# Patient Record
Sex: Male | Born: 1995 | Race: White | Hispanic: No | Marital: Single | Smoking: Former smoker
Health system: Southern US, Community
[De-identification: ages and names within clinical notes are randomized; demographics above are authoritative.]

## PROBLEM LIST (undated history)

## (undated) DIAGNOSIS — J45909 Unspecified asthma, uncomplicated: Secondary | ICD-10-CM

---
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
## 2013-01-13 ENCOUNTER — Emergency Department (HOSPITAL_COMMUNITY)
Admission: EM | Admit: 2013-01-13 | Discharge: 2013-01-13 | Disposition: A | Discharge status: Home / Self Care | Payer: Self-pay | Attending: Emergency Medicine | Admitting: Emergency Medicine

## 2013-01-13 ENCOUNTER — Encounter (HOSPITAL_COMMUNITY): Payer: Self-pay

## 2013-01-13 DIAGNOSIS — Z79899 Other long term (current) drug therapy: Secondary | ICD-10-CM | POA: Insufficient documentation

## 2013-01-13 DIAGNOSIS — W540XXA Bitten by dog, initial encounter: Secondary | ICD-10-CM | POA: Insufficient documentation

## 2013-01-13 DIAGNOSIS — S41151A Open bite of right upper arm, initial encounter: Secondary | ICD-10-CM

## 2013-01-13 DIAGNOSIS — J45909 Unspecified asthma, uncomplicated: Secondary | ICD-10-CM | POA: Insufficient documentation

## 2013-01-13 DIAGNOSIS — Y929 Unspecified place or not applicable: Secondary | ICD-10-CM | POA: Insufficient documentation

## 2013-01-13 DIAGNOSIS — S41109A Unspecified open wound of unspecified upper arm, initial encounter: Secondary | ICD-10-CM | POA: Insufficient documentation

## 2013-01-13 DIAGNOSIS — Y9389 Activity, other specified: Secondary | ICD-10-CM | POA: Insufficient documentation

## 2013-01-13 MED ORDER — TETANUS-DIPHTH-ACELL PERTUSSIS 5-2.5-18.5 LF-MCG/0.5 IM SUSP
0.5000 mL | Freq: Once | INTRAMUSCULAR | Status: DC
  Filled 2013-01-13: qty 0.5

## 2013-01-13 MED ORDER — HYDROCODONE-ACETAMINOPHEN 5-325 MG PO TABS: 1.0000 | ORAL_TABLET | ORAL | Status: DC | PRN

## 2013-01-13 MED ORDER — AMOXICILLIN-POT CLAVULANATE 875-125 MG PO TABS
1.0000 | ORAL_TABLET | Freq: Once | ORAL | Status: AC
  Administered 2013-01-13: 1 via ORAL
  Filled 2013-01-13: qty 1

## 2013-01-13 MED ORDER — HYDROCODONE-ACETAMINOPHEN 5-325 MG PO TABS
1.0000 | ORAL_TABLET | Freq: Once | ORAL | Status: AC
  Administered 2013-01-13: 1 via ORAL
  Filled 2013-01-13: qty 1

## 2013-01-13 MED ORDER — AMOXICILLIN-POT CLAVULANATE 875-125 MG PO TABS: 1.0000 | ORAL_TABLET | Freq: Two times a day (BID) | ORAL | Status: DC

## 2013-01-13 NOTE — ED Provider Notes (Signed)
   History    CSN: [id number] Arrival date & time 01/13/13  4098  First MD Initiated Contact with Patient 01/13/13 1034     Chief Complaint  Patient presents with  . Animal Bite   (Consider location/radiation/quality/duration/timing/severity/associated sxs/prior Treatment) Patient is a 17 y.o. male presenting with animal bite. The history is provided by the patient. No language interpreter was used.  Animal Bite Contact animal:  Dog Associated symptoms: no fever and no numbness   Associated symptoms comment:  He was bitten by a dog known to him last night in the right axilla. He suffered a puncture wound and abrasions. He is here for evaluation of wound.   Past Medical History  Diagnosis Date  . Asthma    History reviewed. No pertinent past surgical history. No family history on file. History  Substance Use Topics  . Smoking status: Never Smoker   . Smokeless tobacco: Not on file  . Alcohol Use: No    Review of Systems  Constitutional: Negative for fever.  HENT: Negative for neck pain.   Gastrointestinal: Negative for nausea.  Musculoskeletal: Positive for myalgias.  Skin: Positive for wound.  Neurological: Negative for numbness.    Allergies  Review of patient's allergies indicates no known allergies.  Home Medications   Current Outpatient Rx  Name  Route  Sig  Dispense  Refill  . albuterol (PROAIR HFA) 108 (90 BASE) MCG/ACT inhaler   Inhalation   Inhale 2 puffs into the lungs every 6 (six) hours as needed for wheezing or shortness of breath.          BP 124/78  Pulse 65  Temp(Src) 98.7 F (37.1 C) (Oral)  Resp 18  Ht 5\' 9"  (1.753 m)  Wt 205 lb (92.987 kg)  BMI 30.26 kg/m2  SpO2 99% Physical Exam  Constitutional: He is oriented to person, place, and time. He appears well-developed and well-nourished.  Neck: Normal range of motion.  Pulmonary/Chest: Effort normal.  Musculoskeletal: Normal range of motion.  Neurological: He is alert and oriented to  person, place, and time.  Skin: Skin is warm and dry.  Abrasions and bruising right axilla with central large puncture wound. No bleeding, drainage, purulence. Tender without induration. Minimal swelling.  Psychiatric: He has a normal mood and affect.    ED Course  Procedures (including critical care time) Labs Reviewed - No data to display No results found. No diagnosis found. 1. Dog bite  MDM  Dog is know to patient and, per patient, Animal Control notified. He reports dog is up-to-date on vaccines.   Arnoldo Hooker, PA-C 01/13/13 1141

## 2013-01-13 NOTE — ED Notes (Signed)
Patient states he is up to date on the tetanus shot, refused tetanus

## 2013-01-13 NOTE — ED Notes (Signed)
Pt reports was bit under r arm by his girlfriend's dog.   Pt doesn't know if the dog's vaccinations are up to date or not.

## 2013-01-13 NOTE — ED Notes (Signed)
Reported dog bite to animal control.

## 2013-01-14 NOTE — ED Provider Notes (Signed)
Medical screening examination/treatment/procedure(s) were performed by non-physician practitioner and as supervising physician I was immediately available for consultation/collaboration.    Pearlene Teat J. Sylvain Hasten, MD 01/14/13 0816 

## 2013-08-14 ENCOUNTER — Emergency Department (HOSPITAL_COMMUNITY): Payer: Worker's Compensation

## 2013-08-14 ENCOUNTER — Emergency Department (HOSPITAL_COMMUNITY): Payer: Worker's Compensation | Admitting: Anesthesiology

## 2013-08-14 ENCOUNTER — Encounter (HOSPITAL_COMMUNITY)
Admission: EM | Disposition: A | Discharge status: Home / Self Care | Payer: Self-pay | Source: Home / Self Care | Attending: Emergency Medicine

## 2013-08-14 ENCOUNTER — Encounter (HOSPITAL_COMMUNITY): Payer: Worker's Compensation | Admitting: Anesthesiology

## 2013-08-14 ENCOUNTER — Encounter (HOSPITAL_COMMUNITY): Payer: Self-pay | Admitting: Emergency Medicine

## 2013-08-14 ENCOUNTER — Ambulatory Visit (HOSPITAL_COMMUNITY)
Admission: EM | Admit: 2013-08-14 | Discharge: 2013-08-14 | Disposition: A | Discharge status: Home / Self Care | Payer: Worker's Compensation | Attending: Emergency Medicine | Admitting: Emergency Medicine

## 2013-08-14 DIAGNOSIS — S66921A Laceration of unspecified muscle, fascia and tendon at wrist and hand level, right hand, initial encounter: Secondary | ICD-10-CM

## 2013-08-14 DIAGNOSIS — J45909 Unspecified asthma, uncomplicated: Secondary | ICD-10-CM | POA: Insufficient documentation

## 2013-08-14 DIAGNOSIS — IMO0002 Reserved for concepts with insufficient information to code with codable children: Secondary | ICD-10-CM | POA: Insufficient documentation

## 2013-08-14 DIAGNOSIS — S61411A Laceration without foreign body of right hand, initial encounter: Secondary | ICD-10-CM

## 2013-08-14 DIAGNOSIS — W3189XA Contact with other specified machinery, initial encounter: Secondary | ICD-10-CM | POA: Insufficient documentation

## 2013-08-14 DIAGNOSIS — Y9269 Other specified industrial and construction area as the place of occurrence of the external cause: Secondary | ICD-10-CM | POA: Insufficient documentation

## 2013-08-14 DIAGNOSIS — S61209A Unspecified open wound of unspecified finger without damage to nail, initial encounter: Secondary | ICD-10-CM | POA: Insufficient documentation

## 2013-08-14 DIAGNOSIS — S65509A Unspecified injury of blood vessel of unspecified finger, initial encounter: Secondary | ICD-10-CM | POA: Insufficient documentation

## 2013-08-14 DIAGNOSIS — Y990 Civilian activity done for income or pay: Secondary | ICD-10-CM | POA: Insufficient documentation

## 2013-08-14 DIAGNOSIS — S61219A Laceration without foreign body of unspecified finger without damage to nail, initial encounter: Secondary | ICD-10-CM

## 2013-08-14 HISTORY — PX: NERVE, TENDON AND ARTERY REPAIR: SHX5695

## 2013-08-14 SURGERY — NERVE, TENDON AND ARTERY REPAIR
Anesthesia: General | Site: Finger | Laterality: Right

## 2013-08-14 MED ORDER — ONDANSETRON HCL 4 MG/2ML IJ SOLN
INTRAMUSCULAR | Status: DC | PRN
  Administered 2013-08-14: 4 mg via INTRAVENOUS

## 2013-08-14 MED ORDER — OXYCODONE HCL 5 MG/5ML PO SOLN: 5.0000 mg | Freq: Once | ORAL | Status: DC | PRN

## 2013-08-14 MED ORDER — MIDAZOLAM HCL 2 MG/2ML IJ SOLN
INTRAMUSCULAR | Status: AC
  Filled 2013-08-14: qty 2

## 2013-08-14 MED ORDER — MORPHINE SULFATE 4 MG/ML IJ SOLN
4.0000 mg | Freq: Once | INTRAMUSCULAR | Status: AC
  Administered 2013-08-14: 4 mg via INTRAMUSCULAR
  Filled 2013-08-14: qty 1

## 2013-08-14 MED ORDER — SULFAMETHOXAZOLE-TRIMETHOPRIM 800-160 MG PO TABS: 1.0000 | ORAL_TABLET | Freq: Two times a day (BID) | ORAL | Status: DC

## 2013-08-14 MED ORDER — LIDOCAINE HCL (PF) 1 % IJ SOLN
INTRAMUSCULAR | Status: AC
  Filled 2013-08-14: qty 30

## 2013-08-14 MED ORDER — LIDOCAINE HCL (PF) 1 % IJ SOLN
INTRAMUSCULAR | Status: DC | PRN
  Administered 2013-08-14: 10 mL

## 2013-08-14 MED ORDER — OXYCODONE-ACETAMINOPHEN 5-325 MG PO TABS: ORAL_TABLET | ORAL | Status: DC

## 2013-08-14 MED ORDER — LIDOCAINE HCL (CARDIAC) 20 MG/ML IV SOLN
INTRAVENOUS | Status: DC | PRN
  Administered 2013-08-14: 100 mg via INTRAVENOUS

## 2013-08-14 MED ORDER — CEFAZOLIN SODIUM-DEXTROSE 2-3 GM-% IV SOLR
2.0000 g | INTRAVENOUS | Status: AC
  Administered 2013-08-14: 2 g via INTRAVENOUS

## 2013-08-14 MED ORDER — PROPOFOL 10 MG/ML IV BOLUS
INTRAVENOUS | Status: DC | PRN
  Administered 2013-08-14: 200 mg via INTRAVENOUS

## 2013-08-14 MED ORDER — BUPIVACAINE HCL (PF) 0.25 % IJ SOLN
INTRAMUSCULAR | Status: DC | PRN
  Administered 2013-08-14: 10 mL

## 2013-08-14 MED ORDER — OXYCODONE-ACETAMINOPHEN 5-325 MG PO TABS
1.0000 | ORAL_TABLET | Freq: Once | ORAL | Status: AC
  Administered 2013-08-14: 2 via ORAL

## 2013-08-14 MED ORDER — PROMETHAZINE HCL 25 MG/ML IJ SOLN: 6.2500 mg | INTRAMUSCULAR | Status: DC | PRN

## 2013-08-14 MED ORDER — LACTATED RINGERS IV SOLN
INTRAVENOUS | Status: DC | PRN
  Administered 2013-08-14 (×2): via INTRAVENOUS

## 2013-08-14 MED ORDER — TETANUS-DIPHTH-ACELL PERTUSSIS 5-2.5-18.5 LF-MCG/0.5 IM SUSP
0.5000 mL | Freq: Once | INTRAMUSCULAR | Status: AC
  Administered 2013-08-14: 0.5 mL via INTRAMUSCULAR
  Filled 2013-08-14: qty 0.5

## 2013-08-14 MED ORDER — FENTANYL CITRATE 0.05 MG/ML IJ SOLN
INTRAMUSCULAR | Status: DC | PRN
  Administered 2013-08-14 (×2): 50 ug via INTRAVENOUS
  Administered 2013-08-14: 100 ug via INTRAVENOUS
  Administered 2013-08-14: 50 ug via INTRAVENOUS

## 2013-08-14 MED ORDER — LIDOCAINE HCL (PF) 1 % IJ SOLN
INTRAVENOUS | Status: DC | PRN
  Administered 2013-08-14: 14:00:00

## 2013-08-14 MED ORDER — OXYCODONE HCL 5 MG PO TABS: 5.0000 mg | ORAL_TABLET | Freq: Once | ORAL | Status: DC | PRN

## 2013-08-14 MED ORDER — SODIUM CHLORIDE 0.9 % IR SOLN
Status: DC | PRN
  Administered 2013-08-14: 2000 mL

## 2013-08-14 MED ORDER — HEPARIN SODIUM (PORCINE) 1000 UNIT/ML IJ SOLN
INTRAMUSCULAR | Status: DC | PRN
  Administered 2013-08-14: 1000 [IU]

## 2013-08-14 MED ORDER — ONDANSETRON HCL 4 MG/2ML IJ SOLN
INTRAMUSCULAR | Status: AC
  Filled 2013-08-14: qty 2

## 2013-08-14 MED ORDER — BUPIVACAINE HCL (PF) 0.25 % IJ SOLN
INTRAMUSCULAR | Status: AC
  Filled 2013-08-14: qty 30

## 2013-08-14 MED ORDER — OXYCODONE-ACETAMINOPHEN 5-325 MG PO TABS
ORAL_TABLET | ORAL | Status: AC
  Filled 2013-08-14: qty 2

## 2013-08-14 MED ORDER — CEFAZOLIN SODIUM-DEXTROSE 2-3 GM-% IV SOLR
INTRAVENOUS | Status: AC
  Filled 2013-08-14: qty 50

## 2013-08-14 MED ORDER — HEPARIN SODIUM (PORCINE) 1000 UNIT/ML IJ SOLN
INTRAMUSCULAR | Status: AC
  Filled 2013-08-14: qty 1

## 2013-08-14 MED ORDER — HYDROMORPHONE HCL PF 1 MG/ML IJ SOLN: 0.2500 mg | INTRAMUSCULAR | Status: DC | PRN

## 2013-08-14 MED ORDER — CHLORHEXIDINE GLUCONATE 4 % EX LIQD: 60.0000 mL | Freq: Once | CUTANEOUS | Status: DC

## 2013-08-14 MED ORDER — MIDAZOLAM HCL 5 MG/5ML IJ SOLN
INTRAMUSCULAR | Status: DC | PRN
Start: 2013-08-14 — End: 2013-08-14
  Administered 2013-08-14: 2 mg via INTRAVENOUS

## 2013-08-14 SURGICAL SUPPLY — 57 items
BAG DECANTER FOR FLEXI CONT (MISCELLANEOUS) IMPLANT
BANDAGE ELASTIC 3 VELCRO ST LF (GAUZE/BANDAGES/DRESSINGS) ×3 IMPLANT
BANDAGE ELASTIC 4 VELCRO ST LF (GAUZE/BANDAGES/DRESSINGS) ×2 IMPLANT
BANDAGE GAUZE ELAST BULKY 4 IN (GAUZE/BANDAGES/DRESSINGS) ×2 IMPLANT
BLADE MINI RND TIP GREEN BEAV (BLADE) IMPLANT
BLADE SURG 15 STRL LF DISP TIS (BLADE) ×2 IMPLANT
BLADE SURG 15 STRL SS (BLADE) ×6
BNDG CMPR 9X4 STRL LF SNTH (GAUZE/BANDAGES/DRESSINGS) ×1
BNDG ESMARK 4X9 LF (GAUZE/BANDAGES/DRESSINGS) ×2 IMPLANT
CHLORAPREP W/TINT 26ML (MISCELLANEOUS) ×1 IMPLANT
CORDS BIPOLAR (ELECTRODE) ×3 IMPLANT
COVER MAYO STAND STRL (DRAPES) ×3 IMPLANT
COVER TABLE BACK 60X90 (DRAPES) ×1 IMPLANT
CUFF TOURNIQUET SINGLE 18IN (TOURNIQUET CUFF) ×2 IMPLANT
DECANTER SPIKE VIAL GLASS SM (MISCELLANEOUS) ×1 IMPLANT
DRAPE EXTREMITY T 121X128X90 (DRAPE) ×1 IMPLANT
DRAPE SURG 17X23 STRL (DRAPES) ×3 IMPLANT
GAUZE XEROFORM 1X8 LF (GAUZE/BANDAGES/DRESSINGS) ×3 IMPLANT
GLOVE BIO SURGEON STRL SZ7.5 (GLOVE) ×3 IMPLANT
GLOVE BIOGEL PI IND STRL 8 (GLOVE) ×1 IMPLANT
GLOVE BIOGEL PI INDICATOR 8 (GLOVE) ×2
GOWN BRE IMP PREV XXLGXLNG (GOWN DISPOSABLE) ×1 IMPLANT
GOWN PREVENTION PLUS XLARGE (GOWN DISPOSABLE) ×5 IMPLANT
KIT BASIN OR (CUSTOM PROCEDURE TRAY) ×3 IMPLANT
LOOP VESSEL MAXI BLUE (MISCELLANEOUS) IMPLANT
NDL HYPO 25X1 1.5 SAFETY (NEEDLE) IMPLANT
NDL SAFETY ECLIPSE 18X1.5 (NEEDLE) IMPLANT
NEEDLE HYPO 18GX1.5 SHARP (NEEDLE)
NEEDLE HYPO 25X1 1.5 SAFETY (NEEDLE) ×3 IMPLANT
NS IRRIG 1000ML POUR BTL (IV SOLUTION) ×1 IMPLANT
PADDING CAST ABS 3INX4YD NS (CAST SUPPLIES) ×2
PADDING CAST ABS 4INX4YD NS (CAST SUPPLIES) ×2
PADDING CAST ABS COTTON 3X4 (CAST SUPPLIES) IMPLANT
PADDING CAST ABS COTTON 4X4 ST (CAST SUPPLIES) ×1 IMPLANT
SPEAR EYE SURG WECK-CEL (MISCELLANEOUS) ×5 IMPLANT
SPLINT FIBERGLASS 3X12 (CAST SUPPLIES) ×2 IMPLANT
SPLINT PLASTER CAST XFAST 3X15 (CAST SUPPLIES) IMPLANT
SPLINT PLASTER XTRA FASTSET 3X (CAST SUPPLIES)
SPONGE GAUZE 4X4 12PLY (GAUZE/BANDAGES/DRESSINGS) ×3 IMPLANT
STOCKINETTE 4X48 STRL (DRAPES) ×1 IMPLANT
SUT ETHIBOND 3-0 V-5 (SUTURE) IMPLANT
SUT ETHILON 4 0 PS 2 18 (SUTURE) ×7 IMPLANT
SUT ETHILON 9 0 BV130 4 (SUTURE) ×4 IMPLANT
SUT FIBERWIRE 4-0 18 TAPR NDL (SUTURE) ×3
SUT MERSILENE 6 0 P 1 (SUTURE) IMPLANT
SUT NYLON 9 0 VRM6 (SUTURE) IMPLANT
SUT PROLENE 6 0 BV (SUTURE) ×2 IMPLANT
SUT PROLENE 6 0 P 1 18 (SUTURE) IMPLANT
SUT SILK 2 0 FS (SUTURE) ×4 IMPLANT
SUT SILK 4 0 PS 2 (SUTURE) IMPLANT
SUT VICRYL 4-0 PS2 18IN ABS (SUTURE) IMPLANT
SUTURE FIBERWR 4-0 18 TAPR NDL (SUTURE) IMPLANT
SYR 3ML LL SCALE MARK (SYRINGE) ×2 IMPLANT
SYR BULB 3OZ (MISCELLANEOUS) ×1 IMPLANT
SYR CONTROL 10ML LL (SYRINGE) ×2 IMPLANT
TOWEL OR 17X24 6PK STRL BLUE (TOWEL DISPOSABLE) ×6 IMPLANT
UNDERPAD 30X30 INCONTINENT (UNDERPADS AND DIAPERS) ×3 IMPLANT

## 2013-08-14 NOTE — Brief Op Note (Signed)
08/14/2013  4:39 PM  PATIENT:  Gayland Curryaniel C Farace  18 y.o. male  PRE-OPERATIVE DIAGNOSIS:  lacerations of right index and long fingers  POST-OPERATIVE DIAGNOSIS:  lacerations of right index and long fingers  PROCEDURE:  Procedure(s):  REPAIR OF INDEX FINGER DIGITAL NERVE AND REPAIR OF LONG FINGER DIGITAL NERVE AND ARTERY. REPAIR OF INDEX FLEXOR TENDON. (Right)  SURGEON:  Surgeon(s) and Role:    * Tami RibasKevin R Kryssa Risenhoover, MD - Primary  PHYSICIAN ASSISTANT:   ASSISTANTS: none   ANESTHESIA:   general  EBL:  Total I/O In: 1200 [I.V.:1200] Out: 5 [Blood:5]  BLOOD ADMINISTERED:none  DRAINS: none   LOCAL MEDICATIONS USED:  MARCAINE     SPECIMEN:  No Specimen  DISPOSITION OF SPECIMEN:  N/A  COUNTS:  YES  TOURNIQUET:   Total Tourniquet Time Documented: Upper Arm (Right) - 150 minutes Total: Upper Arm (Right) - 150 minutes   DICTATION: .Other Dictation: Dictation Number [id number]  PLAN OF CARE: Discharge to home after PACU  PATIENT DISPOSITION:  PACU - hemodynamically stable.

## 2013-08-14 NOTE — Op Note (Signed)
[id number] 

## 2013-08-14 NOTE — Preoperative (Signed)
Beta Blockers   Reason not to administer Beta Blockers:Not Applicable 

## 2013-08-14 NOTE — ED Provider Notes (Signed)
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 Injuring his middle and index fingers. No other injury. On exam patient has lacerations and middle finger, middle phalanx dorsal and volar aspects. And laceration and index finger at distal phalanx, volar aspect. He has full range of motion of fingers. However pain with flexion of the middle finger.  Doug SouSam Mishal Probert, MD 08/14/13 1655

## 2013-08-14 NOTE — Anesthesia Postprocedure Evaluation (Signed)
Anesthesia Post Note  Patient: [name]  Procedure(s) Performed: Procedure(s) (LRB):  REPAIR OF INDEX FINGER DIGITAL NERVE AND REPAIR OF LONG FINGER DIGITAL NERVE AND ARTERY. REPAIR OF INDEX FLEXOR TENDON. (Right)  Anesthesia type: general  Patient location: PACU  Post pain: Pain level controlled  Post assessment: Patient's Cardiovascular Status Stable  Last Vitals:  Filed Vitals:   08/14/13 1715  BP: 125/67  Pulse: 78  Temp:   Resp: 14    Post vital signs: Reviewed and stable  Level of consciousness: sedated  Complications: No apparent anesthesia complications

## 2013-08-14 NOTE — Anesthesia Preprocedure Evaluation (Addendum)
Anesthesia Evaluation  Patient identified by MRN, date of birth, ID band Patient awake    Reviewed: Allergy & Precautions, H&P , NPO status , Patient's Chart, lab work & pertinent test results  Airway Mallampati: III TM Distance: >3 FB Neck ROM: Full    Dental  (+) Teeth Intact and Dental Advisory Given   Pulmonary asthma ,    Pulmonary exam normal       Cardiovascular negative cardio ROS      Neuro/Psych negative neurological ROS  negative psych ROS   GI/Hepatic negative GI ROS, Neg liver ROS,   Endo/Other  negative endocrine ROS  Renal/GU negative Renal ROS     Musculoskeletal   Abdominal Normal abdominal exam  (+)   Peds  Hematology   Anesthesia Other Findings   Reproductive/Obstetrics negative OB ROS                         Anesthesia Physical Anesthesia Plan  ASA: II and emergent  Anesthesia Plan: General LMA   Post-op Pain Management:    Induction:   Airway Management Planned:   Additional Equipment:   Intra-op Plan:   Post-operative Plan:   Informed Consent: I have reviewed the patients History and Physical, chart, labs and discussed the procedure including the risks, benefits and alternatives for the proposed anesthesia with the patient or authorized representative who has indicated his/her understanding and acceptance.   Dental Advisory Given  Plan Discussed with: Anesthesiologist, CRNA and Surgeon  Anesthesia Plan Comments:        Anesthesia Quick Evaluation

## 2013-08-14 NOTE — Anesthesia Procedure Notes (Signed)
Procedure Name: LMA Insertion Date/Time: 08/14/2013 1:46 PM Performed by: Alanda AmassFRIEDMAN, Reshunda Strider A Pre-anesthesia Checklist: Patient identified, Timeout performed, Emergency Drugs available, Suction available and Patient being monitored Patient Re-evaluated:Patient Re-evaluated prior to inductionOxygen Delivery Method: Circle system utilized Preoxygenation: Pre-oxygenation with 100% oxygen Intubation Type: IV induction LMA: LMA inserted LMA Size: 5.0 Number of attempts: 1 Placement Confirmation: positive ETCO2 and breath sounds checked- equal and bilateral Tube secured with: Tape Dental Injury: Teeth and Oropharynx as per pre-operative assessment

## 2013-08-14 NOTE — ED Provider Notes (Signed)
Medical screening examination/treatment/procedure(s) were conducted as a shared visit with non-physician practitioner(s) and myself.  I personally evaluated the patient during the encounter.     Doug SouSam Jeilyn Reznik, MD 08/14/13 1655

## 2013-08-14 NOTE — Discharge Instructions (Signed)

## 2013-08-14 NOTE — Transfer of Care (Signed)
Immediate Anesthesia Transfer of Care Note  Patient: [name]  Procedure(s) Performed: Procedure(s):  REPAIR OF INDEX FINGER DIGITAL NERVE AND REPAIR OF LONG FINGER DIGITAL NERVE AND ARTERY. REPAIR OF INDEX FLEXOR TENDON. (Right)  Patient Location: PACU  Anesthesia Type:General  Level of Consciousness: awake  Airway & Oxygen Therapy: Patient Spontanous Breathing  Post-op Assessment: Report given to PACU RN and Post -op Vital signs reviewed and stable  Post vital signs: Reviewed and stable  Complications: No apparent anesthesia complications

## 2013-08-14 NOTE — H&P (Signed)
  [name] is an 18 y.o. male.   Chief Complaint: right index and long finger lacerations HPI: 18 yo rhd male states he lacerated right index and long fingers at work today when a valve closed on them.  [facility]  NPO over six hours.  Reports no previous injury to right hand and no other injury at this time.  Past Medical History  Diagnosis Date  . Asthma     History reviewed. No pertinent past surgical history.  History reviewed. No pertinent family history. Social History:  reports that he has never smoked. He does not have any smokeless tobacco history on file. He reports that he does not drink alcohol or use illicit drugs.  Allergies: No Known Allergies  Medications Prior to Admission  Medication Sig Dispense Refill  . albuterol (PROAIR HFA) 108 (90 BASE) MCG/ACT inhaler Inhale 2 puffs into the lungs every 6 (six) hours as needed for wheezing or shortness of breath.        No results found for this or any previous visit (from the past 48 hour(s)).  Dg Hand Complete Right  08/14/2013   CLINICAL DATA:  Deep lacerations to the right index and middle finger.  EXAM: RIGHT HAND - COMPLETE 3+ VIEW  COMPARISON:  Wrist 03/03/2012  FINDINGS: Negative for a fracture or dislocation. No evidence for a radiopaque foreign body. Alignment of the right hand is within normal limits.  IMPRESSION: No acute bone abnormality to the right hand.   Electronically Signed   By: Richarda OverlieAdam  Henn M.D.   On: 08/14/2013 11:00     A comprehensive review of systems was negative.  Blood pressure 114/63, pulse 66, temperature 97.8 F (36.6 C), temperature source Oral, resp. rate 15, height 5\' 9"  (1.753 m), weight 210 lb (95.255 kg), SpO2 96.00%.  General appearance: alert, cooperative and appears stated age Head: Normocephalic, without obvious abnormality, atraumatic Neck: supple, symmetrical, trachea midline Resp: clear to auscultation bilaterally Cardio: regular rate and rhythm GI: non  tender Extremities: intact sensation and capillary refill all digits except right index and long with decreased sensation (greater in long than index).  brisk capillary refill all digits.  +epl/fpl/io.  weak dip flexion right long and index.  laceration volar right index finger at distal phalanx.  laceration right long finger volarly just proximal to dip joint. Pulses: 2+ and symmetric Skin: Skin color, texture, turgor normal. No rashes or lesions Neurologic: Grossly normal Incision/Wound: As above  Assessment/Plan Right index and long finger lacerations with possible tendon/artery/nerve laceration.  Recommend OR for exploration and repair as necessary.  Risks, benefits, and alternatives of surgery were discussed and the patient agrees with the plan of care.   [name] 08/14/2013, 1:14 PM

## 2013-08-14 NOTE — Progress Notes (Signed)
Orthopedic Tech Progress Note Patient Details:  Bradley CurryDaniel C [name] Aug 15, 1995 [id number]  Ortho Devices Type of Ortho Device: Arm sling Ortho Device/Splint Interventions: Application   Cammer, Mickie BailJennifer Carol 08/14/2013, 5:15 PM

## 2013-08-14 NOTE — ED Notes (Signed)
All patient belongings given to parents, mother and father Waynard EdwardsMelanie Funder[place] and [place].

## 2013-08-14 NOTE — ED Provider Notes (Signed)
CSN: [id number]     Arrival date & time 08/14/13  0945 History   First MD Initiated Contact with Patient 08/14/13 [phone]     Chief Complaint  Patient presents with  . Hand Injury   (Consider location/radiation/quality/duration/timing/severity/associated sxs/prior Treatment) Patient is a 18 y.o. male presenting with hand injury. The history is provided by the patient and medical records.  Hand Injury  This is a 18 year old male with past medical history significant for asthma, presenting to the ED for right hand injury. Patient states he was at work when his hand was caught in a machine at [employer]enses [employer].  Patient states his hand was somewhat crushed between the parts of the machine and sustained laceration to his right index and middle fingers. Patient was wearing 2 sets of gloves at the time and denied any chemical exposure to hand.  Patient is right-hand dominant. Last PO intake was 0600.  Date of last tetanus unknown.  Past Medical History  Diagnosis Date  . Asthma    History reviewed. No pertinent past surgical history. History reviewed. No pertinent family history. History  Substance Use Topics  . Smoking status: Never Smoker   . Smokeless tobacco: Not on file  . Alcohol Use: No    Review of Systems  Skin: Positive for wound.  All other systems reviewed and are negative.    Allergies  Review of patient's allergies indicates no known allergies.  Home Medications   Current Outpatient Rx  Name  Route  Sig  Dispense  Refill  . albuterol (PROAIR HFA) 108 (90 BASE) MCG/ACT inhaler   Inhalation   Inhale 2 puffs into the lungs every 6 (six) hours as needed for wheezing or shortness of breath.         Marland Kitchen amoxicillin-clavulanate (AUGMENTIN) 875-125 MG per tablet   Oral   Take 1 tablet by mouth every 12 (twelve) hours.   14 tablet   0   . HYDROcodone-acetaminophen (NORCO/VICODIN) 5-325 MG per tablet   Oral   Take 1 tablet by mouth every 4 (four) hours as needed for  pain.   12 tablet   0    BP 137/81  Pulse 69  Temp(Src) 97.8 F (36.6 C) (Oral)  Resp 14  Ht 5\' 9"  (1.753 m)  Wt 210 lb (95.255 kg)  BMI 31.00 kg/m2  SpO2 100%  Physical Exam  Nursing note and vitals reviewed. Constitutional: He is oriented to person, place, and time. He appears well-developed and well-nourished. No distress.  HENT:  Head: Normocephalic and atraumatic.  Mouth/Throat: Oropharynx is clear and moist.  Eyes: Conjunctivae and EOM are normal. Pupils are equal, round, and reactive to light.  Neck: Normal range of motion.  Cardiovascular: Normal rate, regular rhythm and normal heart sounds.   Pulmonary/Chest: Effort normal and breath sounds normal. No respiratory distress. He has no wheezes.  Musculoskeletal: Normal range of motion.       Hands: Lacerations to right volar index and middle fingers Index finger laceration of distal phalanx Middle finger laceration of middle phalanx Full ROM of PIP joints, limited flexon of DIP joints of involved fingers; some active bleeding; strong radial pulse and cap refill; decreased sensation at distal tips of involved fingers  Neurological: He is alert and oriented to person, place, and time.  Skin: Skin is warm and dry. He is not diaphoretic.  Psychiatric: He has a normal mood and affect.       ED Course  Procedures (including critical care time) Labs Review  Labs Reviewed - No data to display Imaging Review Dg Hand Complete Right  08/14/2013   CLINICAL DATA:  Deep lacerations to the right index and middle finger.  EXAM: RIGHT HAND - COMPLETE 3+ VIEW  COMPARISON:  Wrist 03/03/2012  FINDINGS: Negative for a fracture or dislocation. No evidence for a radiopaque foreign body. Alignment of the right hand is within normal limits.  IMPRESSION: No acute bone abnormality to the right hand.   Electronically Signed   By: Richarda OverlieAdam  Henn M.D.   On: 08/14/2013 11:00    EKG Interpretation   None       MDM   1. Laceration of right  hand with tendon involvement including fingers     X-ray negative for acute fracture. Tetanus updated.  Pt continues to have some difficulty flexing the right middle finger at DIP joint and decreased sensation from baseline-- questionable partial flexor tendon injury. Consulted hand surgery, Dr. Merlyn LotKuzma, will evaluate pt in the ED and determine plan of care.  Pt will continue to be kept NPO.   Dr. Merlyn LotKuzma has evaluated pt in the ED and elected to take pt to OR for wound exploration and repair.  Pt remains stable, NAD.  Garlon HatchetLisa M Sanders, PA-C 08/14/13 1316

## 2013-08-14 NOTE — ED Notes (Addendum)
Patient presents to ED via EMS. Patient was at work when his index and middle fingers on right hand were caught in a machine. EMS placed dressing with bleeding controled.

## 2013-08-15 NOTE — Op Note (Signed)
NAMLucas [name]:  [name], [name]           ACCOUNT NO.:  [id number]  MEDICAL RECORD NO.:  [id number]  LOCATION:  [facility]                         FACILITY:  [facility]  PHYSICIAN:  Betha LoaKevin Hollie Wojahn, MD        DATE OF BIRTH:  12/12/1995  DATE OF PROCEDURE:  08/14/2013 DATE OF DISCHARGE:  08/14/2013                              OPERATIVE REPORT   PREOPERATIVE DIAGNOSES:  Right index and long finger lacerations with tendon, artery, nerve injury.  POSTOPERATIVE DIAGNOSES:  Right index finger FDP laceration and radial digital nerve branch laceration and right long finger ulnar digital nerve and artery laceration.  PROCEDURE:   1. Right index finger repair of FDP laceration 2. Repair of index radial digital nerve branch under microscope  3. Repair of right long finger ulnar digital nerve under microscope 4. Repair of right long finger ulnar digital artery under microscope  SURGEON:  Betha LoaKevin Iylah Dworkin, MD  ASSISTANT:  None.  ANESTHESIA:  General.  IV FLUIDS:  Per Anesthesia flow sheet.  ESTIMATED BLOOD LOSS:  Minimal.  COMPLICATIONS:  None.  SPECIMENS:  None.  TOURNIQUET TIME:  150 minutes.  DISPOSITION:  Stable to PACU.  INDICATIONS:  Mr. [name] is a 18 year old right-hand dominant male who states at work this morning, he lacerated his right index and long finger on a valve body.  [facility], I was consulted for management of the injury. On evaluation, he had decreased sensation in the tips of the index and long finger and pain with flexion of the DIP joints.  I recommended going to the operating room for exploration of the wounds with possible tendon, artery, and nerve repair as necessary.  Risks, benefits, and alternatives of surgery were discussed including risk of blood loss, infection, damage to nerves, vessels, tendons, ligaments, bone; failure of surgery; need for additional surgery, complications with wound healing, continued  pain, and stiffness.  He voiced understanding these risks and elected to proceed.  OPERATIVE COURSE:  After being identified preoperatively by myself, the patient and I agreed upon procedure and site of procedure.  Surgical site was marked.  The risks, benefits, and alternatives of surgery were reviewed and wished to proceed.  Surgical consent had been signed.  He was given IV Ancef as preoperative antibiotic prophylaxis.  He was transferred to the operating room and placed on the operating room table in supine position with the right upper extremity on arm board.  General anesthesia was induced by the anesthesiologist.  The right upper extremity was prepped and draped in normal sterile orthopedic fashion. A surgical pause was performed between surgeon, anesthesia, operating staff, and all were in agreement as to the patient, procedure, and site of procedure.  Tourniquet at the proximal aspect of the extremity was inflated to 250 mmHg after exsanguination of the limb with an Esmarch bandage.  The wounds were extended both proximally and distally in a Brunner fashion.  In the long finger, the flexor tendon was identified and was intact.  The sheath was not violated.  The ulnar digital nerve and artery were identified and had been completely lacerated.  The radial digital nerve and artery were identified and were  intact.  In the index finger, there was laceration of the FDP insertion distally.  The proximal aspect of the insertion was intact.  The radial digital nerve and artery were identified, and there was a laceration to a branch of the radial digital nerve after the trifurcation.  The ulnar digital nerve and artery were identified and were intact.  The wounds were copiously irrigated with 2000 mL of sterile saline by cysto tubing.  The laceration to the index finger, FDP tendon was repaired with 4-0 FiberWire suture in a figure-of-eight fashion.  The microscope was brought in.  The  index finger was addressed first.  The branch of the radial digital nerve was repaired with 9-0 nylon suture in an interrupted circumferential fashion.  The radial digital artery was identified and was intact.  The ulnar digital nerve and artery were visualized under the microscope and were intact including the trifurcation of the nerve.  Attention was turned to the long finger. The radial digital nerve and artery were identified and were intact. The ulnar digital nerve and artery were 100% lacerated.  The artery was cleared of its adventitia and the lumen cleared of clot.  It was rinsed with Dora Sims solution.  The 9-0 nylon suture was used in an interrupted circumferential fashion to repair the artery.  Good apposition was obtained.  The proximal stump had to be shortened because of a split in the artery.  There was no tension.  The ulnar digital nerve was repaired in an interrupted circumferential fashion as well. This provided good apposition of the nerve ends.  The wounds were again copiously irrigated with sterile saline.  They were closed with 4-0 nylon suture in a horizontal mattress and interrupted fashion.  Digital block was performed to the index and long finger with 10 mL of 0.25% plain Marcaine to aid in postoperative analgesia.  The wounds were then dressed with sterile Xeroform, 4x4s, and wrapped with a Kerlix bandage. A dorsal blocking splint was placed with the wrist flexed approximately 40 degrees and the MPs flexed and IPs slightly flexed, this took tension off the ulnar digital nerve repair of the long finger.  This was wrapped with Kerlix and Ace bandage.  Tourniquet had been deflated at 150 minutes.  The fingertips were all pink with brisk capillary refill after deflation of tourniquet.  Operative drapes were broken down.  The patient was awoken from anesthesia safely.  He was transferred back to stretcher and taken to PACU in stable condition.  I will see him back  in the office in 1 week for postoperative followup.  I will give him Percocet 5/325, 1-2 p.o. q.6h. p.r.n. pain, dispensed #40 and Bactrim DS 1 p.o. b.i.d. x7 days.     Betha Loa, MD     KK/MEDQ  D:  08/14/2013  T:  08/15/2013  Job:  [id number]

## 2013-08-16 ENCOUNTER — Encounter (HOSPITAL_COMMUNITY): Payer: Self-pay | Admitting: Orthopedic Surgery

## 2013-09-07 ENCOUNTER — Encounter (HOSPITAL_COMMUNITY): Payer: Self-pay | Admitting: Emergency Medicine

## 2013-09-07 ENCOUNTER — Emergency Department (HOSPITAL_COMMUNITY): Payer: BC Managed Care – PPO

## 2013-09-07 ENCOUNTER — Emergency Department (HOSPITAL_COMMUNITY)
Admission: EM | Admit: 2013-09-07 | Discharge: 2013-09-07 | Discharge status: Against Medical Advice | Payer: BC Managed Care – PPO | Attending: Emergency Medicine | Admitting: Emergency Medicine

## 2013-09-07 DIAGNOSIS — S6990XA Unspecified injury of unspecified wrist, hand and finger(s), initial encounter: Secondary | ICD-10-CM | POA: Insufficient documentation

## 2013-09-07 DIAGNOSIS — Z792 Long term (current) use of antibiotics: Secondary | ICD-10-CM | POA: Insufficient documentation

## 2013-09-07 DIAGNOSIS — Y9389 Activity, other specified: Secondary | ICD-10-CM | POA: Insufficient documentation

## 2013-09-07 DIAGNOSIS — S60219A Contusion of unspecified wrist, initial encounter: Secondary | ICD-10-CM | POA: Insufficient documentation

## 2013-09-07 DIAGNOSIS — J45909 Unspecified asthma, uncomplicated: Secondary | ICD-10-CM | POA: Insufficient documentation

## 2013-09-07 DIAGNOSIS — S60212A Contusion of left wrist, initial encounter: Secondary | ICD-10-CM

## 2013-09-07 DIAGNOSIS — Z79899 Other long term (current) drug therapy: Secondary | ICD-10-CM | POA: Insufficient documentation

## 2013-09-07 DIAGNOSIS — Y92410 Unspecified street and highway as the place of occurrence of the external cause: Secondary | ICD-10-CM | POA: Insufficient documentation

## 2013-09-07 MED ORDER — ACETAMINOPHEN 500 MG PO TABS: 1000.0000 mg | ORAL_TABLET | Freq: Once | ORAL | Status: DC
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
## 2013-09-07 NOTE — ED Notes (Signed)
PT reports he is now experiencing sharp chest pains that come and go and is SOB.

## 2013-09-07 NOTE — ED Notes (Signed)
Pt refuses to stay for any further treatment, risks discussed with pt by the MD. Pt states he understands the risks and that he cannot stay any longer.

## 2013-09-07 NOTE — ED Notes (Signed)
MD at bedside. 
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
## 2013-09-07 NOTE — ED Notes (Signed)
ED EKG not performed as pt refused to allow us to carry out the order. Tylenol not adm as the pt refused the medication/any further treatment.

## 2014-01-18 ENCOUNTER — Institutional Professional Consult (permissible substitution): Payer: Self-pay | Admitting: Internal Medicine

## 2014-01-26 ENCOUNTER — Encounter: Payer: Self-pay | Admitting: *Deleted

## 2014-01-26 ENCOUNTER — Ambulatory Visit (INDEPENDENT_AMBULATORY_CARE_PROVIDER_SITE_OTHER): Payer: BC Managed Care – PPO | Admitting: Internal Medicine

## 2014-01-26 ENCOUNTER — Encounter: Payer: Self-pay | Admitting: Internal Medicine

## 2014-01-26 VITALS — BP 128/76 | HR 65 | Temp 97.9°F | Ht 69.0 in | Wt 211.2 lb

## 2014-01-26 DIAGNOSIS — J45909 Unspecified asthma, uncomplicated: Secondary | ICD-10-CM | POA: Insufficient documentation

## 2014-01-26 MED ORDER — ALBUTEROL SULFATE HFA 108 (90 BASE) MCG/ACT IN AERS: 2.0000 | INHALATION_SPRAY | Freq: Four times a day (QID) | RESPIRATORY_TRACT | Status: DC | PRN

## 2014-01-26 NOTE — Assessment & Plan Note (Signed)
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

## 2014-01-26 NOTE — Progress Notes (Signed)
   Subjective:    Patient ID: Bradley CurryDaniel C Radick, male    DOB: [birth year] MRN: [id number]  HPI  18 yowm asthma dx age 89 no symptoms or need for rescue  inhaler this year but needs clearance for [employer].  Has had ? eia in past. With viral uri's has needed saba in past but not in last year. Has not seen asthma specialist/ allergist  Has been running in heat ok x 30 min doing ok s rescue all conditions  No obvious day to day or daytime variabilty or assoc chronic cough or cp or chest tightness, subjective wheeze overt sinus or hb symptoms. No unusual exp hx or h/o childhood pna/ asthma or knowledge of premature birth.  Sleeping ok without nocturnal  or early am exacerbation  of respiratory  c/o's or need for noct saba. Also denies any obvious fluctuation of symptoms with weather or environmental changes or other aggravating or alleviating factors except as outlined above   Current Medications, Allergies, Complete Past Medical History, Past Surgical History, Family History, and Social History were reviewed in [facility] electronic medical record.  ROS  The following are not active complaints unless bolded sore throat, dysphagia, dental problems, itching, sneezing,  nasal congestion or excess/ purulent secretions, ear ache,   fever, chills, sweats, unintended wt loss, pleuritic or exertional cp, hemoptysis,  orthopnea pnd or leg swelling, presyncope, palpitations, heartburn, abdominal pain, anorexia, nausea, vomiting, diarrhea  or change in bowel or urinary habits, change in stools or urine, dysuria,hematuria,  rash, arthralgias, visual complaints, headache, numbness weakness or ataxia or problems with walking or coordination,  change in mood/affect or memory.         Review of Systems  Constitutional: Negative for fever and unexpected weight change.  HENT: Negative for congestion, dental problem, ear pain, nosebleeds, postnasal drip, rhinorrhea, sinus pressure, sneezing, sore throat and  trouble swallowing.   Eyes: Negative for redness and itching.  Respiratory: Positive for shortness of breath. Negative for cough, chest tightness and wheezing.   Cardiovascular: Negative for palpitations and leg swelling.  Gastrointestinal: Negative for nausea and vomiting.  Genitourinary: Negative for dysuria.  Musculoskeletal: Negative for joint swelling.  Skin: Negative for rash.  Neurological: Negative for headaches.  Hematological: Does not bruise/bleed easily.  Psychiatric/Behavioral: Negative for dysphoric mood. The patient is not nervous/anxious.        Objective:   Physical Exam  amb wm nad  Wt Readings from Last 3 Encounters:  01/26/14 211 lb 3.2 oz (95.8 kg)  09/07/13 205 lb (92.987 kg) (94%*, Z = 1.54)  08/14/13 210 lb (95.255 kg) (95%*, Z = 1.66)   * Growth percentiles are based on CDC 2-20 Years data.      HEENT: nl dentition, turbinates, and orophanx. Nl external ear canals without cough reflex   NECK :  without JVD/Nodes/TM/ nl carotid upstrokes bilaterally   LUNGS: no acc muscle use, clear to A and P bilaterally without cough on insp or exp maneuvers   CV:  RRR  no s3 or murmur or increase in P2, no edema   ABD:  soft and nontender with nl excursion in the supine position. No bruits or organomegaly, bowel sounds nl  MS:  warm without deformities, calf tenderness, cyanosis or clubbing  SKIN: warm and dry without lesions    NEURO:  alert, approp, no deficits         Assessment & Plan:

## 2014-01-26 NOTE — Patient Instructions (Addendum)
You have no evidence of asthma at present that would prevent you  from [employer]  Because you have a history of asthma in past, however, you should have access to a rescue inhaler like albuterol/ proair to use up to 2 pffs every 4 hours as needed  Should you ever need to use proair on a more regular basis (more than twice weekly) you would need to return here asap or seek medical attention because that would suggest you have chronic asthma, not acute intermittent, which can just be controlled, should it develop, with as needed albuterol

## 2015-07-31 ENCOUNTER — Emergency Department (HOSPITAL_COMMUNITY)
Admission: EM | Admit: 2015-07-31 | Discharge: 2015-07-31 | Disposition: A | Discharge status: Home / Self Care | Payer: Self-pay | Attending: Emergency Medicine | Admitting: Emergency Medicine

## 2015-07-31 ENCOUNTER — Encounter (HOSPITAL_COMMUNITY): Payer: Self-pay | Admitting: *Deleted

## 2015-07-31 ENCOUNTER — Emergency Department (HOSPITAL_COMMUNITY): Payer: Self-pay

## 2015-07-31 DIAGNOSIS — J069 Acute upper respiratory infection, unspecified: Secondary | ICD-10-CM | POA: Insufficient documentation

## 2015-07-31 DIAGNOSIS — R000 Tachycardia, unspecified: Secondary | ICD-10-CM | POA: Insufficient documentation

## 2015-07-31 DIAGNOSIS — J45901 Unspecified asthma with (acute) exacerbation: Secondary | ICD-10-CM | POA: Insufficient documentation

## 2015-07-31 MED ORDER — AZITHROMYCIN 250 MG PO TABS: 250.0000 mg | ORAL_TABLET | Freq: Every day | ORAL | Status: DC

## 2015-07-31 MED ORDER — GUAIFENESIN-CODEINE 100-10 MG/5ML PO SOLN
10.0000 mL | Freq: Once | ORAL | Status: AC
  Administered 2015-07-31: 10 mL via ORAL
  Filled 2015-07-31: qty 10

## 2015-07-31 MED ORDER — ACETAMINOPHEN 500 MG PO TABS
1000.0000 mg | ORAL_TABLET | Freq: Once | ORAL | Status: AC
  Administered 2015-07-31: 1000 mg via ORAL
  Filled 2015-07-31: qty 2

## 2015-07-31 MED ORDER — ALBUTEROL SULFATE HFA 108 (90 BASE) MCG/ACT IN AERS
2.0000 | INHALATION_SPRAY | Freq: Once | RESPIRATORY_TRACT | Status: AC
  Administered 2015-07-31: 2 via RESPIRATORY_TRACT
  Filled 2015-07-31: qty 6.7

## 2015-07-31 MED ORDER — AZITHROMYCIN 250 MG PO TABS
500.0000 mg | ORAL_TABLET | Freq: Once | ORAL | Status: AC
  Administered 2015-07-31: 500 mg via ORAL
  Filled 2015-07-31: qty 2

## 2015-07-31 MED ORDER — IBUPROFEN 800 MG PO TABS
800.0000 mg | ORAL_TABLET | Freq: Once | ORAL | Status: AC
  Administered 2015-07-31: 800 mg via ORAL
  Filled 2015-07-31: qty 1

## 2015-07-31 MED ORDER — IBUPROFEN 800 MG PO TABS: 800.0000 mg | ORAL_TABLET | Freq: Three times a day (TID) | ORAL | Status: DC | PRN

## 2015-07-31 MED ORDER — GUAIFENESIN-CODEINE 100-10 MG/5ML PO SOLN: 5.0000 mL | Freq: Four times a day (QID) | ORAL | Status: DC | PRN

## 2015-07-31 NOTE — ED Provider Notes (Signed)
TIME SEEN: 3:00 AM  CHIEF COMPLAINT: Fever, cough  HPI: Pt is a 20 y.o. male with history of asthma who presents to the emergency department with complaints of 9 days of fever, cough with green sputum production, body aches. Has had some intermittent wheezing. No shortness of breath. No vomiting or diarrhea. States he did have an influenza vaccination in 2016. No known sick contacts or recent travel.  No history of PE, DVT, exogenous estrogen use, fracture, surgery, trauma, hospitalization, prolonged travel. No lower extremity swelling or pain. No calf tenderness.   ROS: See HPI Constitutional:  fever  Eyes: no drainage  ENT: no runny nose   Cardiovascular:  no chest pain  Resp: no SOB  GI: no vomiting GU: no dysuria Integumentary: no rash  Allergy: no hives  Musculoskeletal: no leg swelling  Neurological: no slurred speech ROS otherwise negative  PAST MEDICAL HISTORY/PAST SURGICAL HISTORY:  Past Medical History  Diagnosis Date  . Asthma     MEDICATIONS:  Prior to Admission medications   Medication Sig Start Date End Date Taking? Authorizing Provider  azithromycin (ZITHROMAX) 250 MG tablet Take 1 tablet (250 mg total) by mouth daily. For four more days 07/31/15   Ladarian Bonczek N Jodee Wagenaar, DO  guaiFENesin-codeine 100-10 MG/5ML syrup Take 5-10 mLs by mouth every 6 (six) hours as needed for cough. 07/31/15   Marshia Tropea N Sufyan Meidinger, DO  ibuprofen (ADVIL,MOTRIN) 800 MG tablet Take 1 tablet (800 mg total) by mouth every 8 (eight) hours as needed for mild pain. 07/31/15   Aisling Emigh N Cael Worth, DO    ALLERGIES:  No Known Allergies  SOCIAL HISTORY:  Social History  Substance Use Topics  . Smoking status: Never Smoker   . Smokeless tobacco: Not on file  . Alcohol Use: No    FAMILY HISTORY: History reviewed. No pertinent family history.  EXAM: BP 125/81 mmHg  Pulse 103  Temp(Src) 98.5 F (36.9 C) (Oral)  Resp 20  Ht  (1.753 m)  Wt 170 lb (77.111 kg)  BMI 25.09 kg/m2  SpO2  96% CONSTITUTIONAL: Alert and oriented and responds appropriately to questions. Coughing, febrile but nontoxic appearing, in no distress HEAD: Normocephalic EYES: Conjunctivae clear, PERRL ENT: normal nose; no rhinorrhea; moist mucous membranes; pharynx without lesions noted NECK: Supple, no meningismus, no LAD  CARD: Regular and tachycardic; S1 and S2 appreciated; no murmurs, no clicks, no rubs, no gallops RESP: Normal chest excursion without splinting or tachypnea; breath sounds clear and equal bilaterally; no wheezes, no rhonchi, no rales, no hypoxia or respiratory distress, speaking full sentences ABD/GI: Normal bowel sounds; non-distended; soft, non-tender, no rebound, no guarding, no peritoneal signs BACK:  The back appears normal and is non-tender to palpation, there is no CVA tenderness EXT: Normal ROM in all joints; non-tender to palpation; no edema; normal capillary refill; no cyanosis, no calf tenderness or swelling    SKIN: Normal color for age and race; warm NEURO: Moves all extremities equally, sensation to light touch intact diffusely, cranial nerves II through XII intact PSYCH: The patient's mood and manner are appropriate. Grooming and personal hygiene are appropriate.  MEDICAL DECISION MAKING: Patient here with fever, cough. Patient was initially tachycardic but this has improved with Tylenol as his temperature has come down. He reports already feeling much better. Chest x-ray shows no infiltrate but given 9 days of persistent symptoms I will treat him as if this is atypical pneumonia with azithromycin. Discussed with patient that this may also be viral and may even be  the flu and if it is viral than the antibiotics would not be helpful. Have discussed with him that he is outside of the treatment window for Tamiflu. We'll discharge him with a albuterol inhaler, prescription for guaifenesin with codeine. Have advised him to continue alternating Tylenol and ibuprofen for fever and  pain. Recommended increase fluid intake and rest. I feel he is safe to be discharged. We'll give him outpatient PCP follow-up information. Patient verbalizes understanding and is comfortable with this plan.       Layla Maw Stina Gane, DO 07/31/15 [id number]

## 2015-07-31 NOTE — Discharge Instructions (Signed)
Upper Respiratory Infection, Adult Most upper respiratory infections (URIs) are a viral infection of the air passages leading to the lungs. A URI affects the nose, throat, and upper air passages. The most common type of URI is nasopharyngitis and is typically referred to as "the common cold." URIs run their course and usually go away on their own. Most of the time, a URI does not require medical attention, but sometimes a bacterial infection in the upper airways can follow a viral infection. This is called a secondary infection. Sinus and middle ear infections are common types of secondary upper respiratory infections. Bacterial pneumonia can also complicate a URI. A URI can worsen asthma and chronic obstructive pulmonary disease (COPD). Sometimes, these complications can require emergency medical care and may be life threatening.  CAUSES Almost all URIs are caused by viruses. A virus is a type of germ and can spread from one person to another.  RISKS FACTORS You may be at risk for a URI if:   You smoke.   You have chronic heart or lung disease.  You have a weakened defense (immune) system.   You are very young or very old.   You have nasal allergies or asthma.  You work in crowded or poorly ventilated areas.  You work in health care facilities or schools. SIGNS AND SYMPTOMS  Symptoms typically develop 2-3 days after you come in contact with a cold virus. Most viral URIs last 7-10 days. However, viral URIs from the influenza virus (flu virus) can last 14-18 days and are typically more severe. Symptoms may include:   Runny or stuffy (congested) nose.   Sneezing.   Cough.   Sore throat.   Headache.   Fatigue.   Fever.   Loss of appetite.   Pain in your forehead, behind your eyes, and over your cheekbones (sinus pain).  Muscle aches.  DIAGNOSIS  Your health care provider may diagnose a URI by:  Physical exam.  Tests to check that your symptoms are not due to  another condition such as:  Strep throat.  Sinusitis.  Pneumonia.  Asthma. TREATMENT  A URI goes away on its own with time. It cannot be cured with medicines, but medicines may be prescribed or recommended to relieve symptoms. Medicines may help:  Reduce your fever.  Reduce your cough.  Relieve nasal congestion. HOME CARE INSTRUCTIONS   Take medicines only as directed by your health care provider.   Gargle warm saltwater or take cough drops to comfort your throat as directed by your health care provider.  Use a warm mist humidifier or inhale steam from a shower to increase air moisture. This may make it easier to breathe.  Drink enough fluid to keep your urine clear or pale yellow.   Eat soups and other clear broths and maintain good nutrition.   Rest as needed.   Return to work when your temperature has returned to normal or as your health care provider advises. You may need to stay home longer to avoid infecting others. You can also use a face mask and careful hand washing to prevent spread of the virus.  Increase the usage of your inhaler if you have asthma.   Do not use any tobacco products, including cigarettes, chewing tobacco, or electronic cigarettes. If you need help quitting, ask your health care provider. PREVENTION  The best way to protect yourself from getting a cold is to practice good hygiene.   Avoid oral or hand contact with people with cold   symptoms.   Wash your hands often if contact occurs.  There is no clear evidence that vitamin C, vitamin E, echinacea, or exercise reduces the chance of developing a cold. However, it is always recommended to get plenty of rest, exercise, and practice good nutrition.  SEEK MEDICAL CARE IF:   You are getting worse rather than better.   Your symptoms are not controlled by medicine.   You have chills.  You have worsening shortness of breath.  You have brown or red mucus.  You have yellow or brown nasal  discharge.  You have pain in your face, especially when you bend forward.  You have a fever.  You have swollen neck glands.  You have pain while swallowing.  You have white areas in the back of your throat. SEEK IMMEDIATE MEDICAL CARE IF:   You have severe or persistent:  Headache.  Ear pain.  Sinus pain.  Chest pain.  You have chronic lung disease and any of the following:  Wheezing.  Prolonged cough.  Coughing up blood.  A change in your usual mucus.  You have a stiff neck.  You have changes in your:  Vision.  Hearing.  Thinking.  Mood. MAKE SURE YOU:   Understand these instructions.  Will watch your condition.  Will get help right away if you are not doing well or get worse.   This information is not intended to replace advice given to you by your health care provider. Make sure you discuss any questions you have with your health care provider.   Document Released: 12/18/2000 Document Revised: 11/08/2014 Document Reviewed: 09/29/2013 Elsevier Interactive Patient Education 2016 [employer]  

## 2015-07-31 NOTE — ED Notes (Signed)
Pt c/o generalized body aches and cough x 9 days; pt states what he coughs up is white/green; pt states he has feelings of hot and cold

## 2015-08-02 ENCOUNTER — Emergency Department (HOSPITAL_COMMUNITY)
Admission: EM | Admit: 2015-08-02 | Discharge: 2015-08-02 | Disposition: A | Discharge status: Home / Self Care | Payer: Self-pay | Attending: Emergency Medicine | Admitting: Emergency Medicine

## 2015-08-02 ENCOUNTER — Emergency Department (HOSPITAL_COMMUNITY): Payer: Self-pay

## 2015-08-02 ENCOUNTER — Encounter (HOSPITAL_COMMUNITY): Payer: Self-pay | Admitting: Emergency Medicine

## 2015-08-02 DIAGNOSIS — J45901 Unspecified asthma with (acute) exacerbation: Secondary | ICD-10-CM | POA: Insufficient documentation

## 2015-08-02 DIAGNOSIS — J159 Unspecified bacterial pneumonia: Secondary | ICD-10-CM | POA: Insufficient documentation

## 2015-08-02 DIAGNOSIS — Z79899 Other long term (current) drug therapy: Secondary | ICD-10-CM | POA: Insufficient documentation

## 2015-08-02 DIAGNOSIS — J189 Pneumonia, unspecified organism: Secondary | ICD-10-CM

## 2015-08-02 LAB — COMPREHENSIVE METABOLIC PANEL
ALT: 47 U/L (ref 17–63)
ANION GAP: 11 (ref 5–15)
AST: 36 U/L (ref 15–41)
Albumin: 3.9 g/dL (ref 3.5–5.0)
Alkaline Phosphatase: 71 U/L (ref 38–126)
BUN: 8 mg/dL (ref 6–20)
CHLORIDE: 102 mmol/L (ref 101–111)
CO2: 25 mmol/L (ref 22–32)
Calcium: 9.1 mg/dL (ref 8.9–10.3)
Creatinine, Ser: 0.97 mg/dL (ref 0.61–1.24)
Glucose, Bld: 88 mg/dL (ref 65–99)
POTASSIUM: 3.3 mmol/L — AB (ref 3.5–5.1)
SODIUM: 138 mmol/L (ref 135–145)
Total Bilirubin: 0.9 mg/dL (ref 0.3–1.2)
Total Protein: 7.5 g/dL (ref 6.5–8.1)

## 2015-08-02 LAB — CBC WITH DIFFERENTIAL/PLATELET
Basophils Absolute: 0 10*3/uL (ref 0.0–0.1)
Basophils Relative: 0 %
EOS ABS: 0.2 10*3/uL (ref 0.0–0.7)
Eosinophils Relative: 1 %
HEMATOCRIT: 42.9 % (ref 39.0–52.0)
HEMOGLOBIN: 14.9 g/dL (ref 13.0–17.0)
LYMPHS ABS: 2.4 10*3/uL (ref 0.7–4.0)
LYMPHS PCT: 14 %
MCH: 29.9 pg (ref 26.0–34.0)
MCHC: 34.7 g/dL (ref 30.0–36.0)
MCV: 86 fL (ref 78.0–100.0)
Monocytes Absolute: 1.6 10*3/uL — ABNORMAL HIGH (ref 0.1–1.0)
Monocytes Relative: 9 %
NEUTROS ABS: 12.9 10*3/uL — AB (ref 1.7–7.7)
NEUTROS PCT: 76 %
Platelets: 346 10*3/uL (ref 150–400)
RBC: 4.99 MIL/uL (ref 4.22–5.81)
RDW: 13 % (ref 11.5–15.5)
WBC: 17.2 10*3/uL — AB (ref 4.0–10.5)

## 2015-08-02 MED ORDER — AZITHROMYCIN 250 MG PO TABS
500.0000 mg | ORAL_TABLET | Freq: Once | ORAL | Status: AC
  Administered 2015-08-02: 500 mg via ORAL
  Filled 2015-08-02: qty 2

## 2015-08-02 MED ORDER — IPRATROPIUM-ALBUTEROL 0.5-2.5 (3) MG/3ML IN SOLN: 3.0000 mL | Freq: Four times a day (QID) | RESPIRATORY_TRACT | Status: DC

## 2015-08-02 MED ORDER — AZITHROMYCIN 250 MG PO TABS: 250.0000 mg | ORAL_TABLET | Freq: Every day | ORAL | Status: DC

## 2015-08-02 MED ORDER — IPRATROPIUM BROMIDE 0.02 % IN SOLN: 0.5000 mg | Freq: Once | RESPIRATORY_TRACT | Status: DC

## 2015-08-02 MED ORDER — ALBUTEROL SULFATE (2.5 MG/3ML) 0.083% IN NEBU
5.0000 mg | INHALATION_SOLUTION | Freq: Once | RESPIRATORY_TRACT | Status: DC
  Filled 2015-08-02: qty 6

## 2015-08-02 MED ORDER — IPRATROPIUM-ALBUTEROL 0.5-2.5 (3) MG/3ML IN SOLN: 3.0000 mL | Freq: Once | RESPIRATORY_TRACT | Status: DC

## 2015-08-02 MED ORDER — AMOXICILLIN 250 MG PO CAPS
1000.0000 mg | ORAL_CAPSULE | Freq: Once | ORAL | Status: AC
  Administered 2015-08-02: 1000 mg via ORAL
  Filled 2015-08-02 (×2): qty 4

## 2015-08-02 MED ORDER — ALBUTEROL SULFATE HFA 108 (90 BASE) MCG/ACT IN AERS
2.0000 | INHALATION_SPRAY | Freq: Once | RESPIRATORY_TRACT | Status: AC
  Administered 2015-08-02: 2 via RESPIRATORY_TRACT
  Filled 2015-08-02: qty 6.7

## 2015-08-02 MED ORDER — ALBUTEROL SULFATE (2.5 MG/3ML) 0.083% IN NEBU
2.5000 mg | INHALATION_SOLUTION | Freq: Once | RESPIRATORY_TRACT | Status: AC
  Administered 2015-08-02: 2.5 mg via RESPIRATORY_TRACT

## 2015-08-02 MED ORDER — IPRATROPIUM-ALBUTEROL 0.5-2.5 (3) MG/3ML IN SOLN
3.0000 mL | Freq: Once | RESPIRATORY_TRACT | Status: AC
  Administered 2015-08-02: 3 mL via RESPIRATORY_TRACT
  Filled 2015-08-02: qty 3

## 2015-08-02 MED ORDER — IPRATROPIUM-ALBUTEROL 0.5-2.5 (3) MG/3ML IN SOLN
RESPIRATORY_TRACT | Status: AC
  Administered 2015-08-02: 3 mL
  Filled 2015-08-02: qty 3

## 2015-08-02 MED ORDER — AMOXICILLIN 500 MG PO CAPS: 1000.0000 mg | ORAL_CAPSULE | Freq: Two times a day (BID) | ORAL | Status: DC

## 2015-08-02 NOTE — Discharge Instructions (Signed)

## 2015-08-02 NOTE — ED Notes (Signed)
Pt reports strong, productive cough, fever, and SOB since Jan 14th. Pt has audible wheezing.

## 2015-08-02 NOTE — ED Notes (Signed)
Respiratory notified of new orders 

## 2015-08-02 NOTE — ED Provider Notes (Signed)
CSN: [id number]     Arrival date & time 08/02/15  1139 History  By signing my name below, I, Marica Otter, attest that this documentation has been prepared under the direction and in the presence of Azalia Bilis, MD. Electronically Signed: Marica Otter, ED Scribe. 08/02/2015. 12:30 PM.  Chief Complaint  Patient presents with  . Cough   The history is provided by the patient. No language interpreter was used.   PCP: No PCP Per Patient HPI Comments: [name] is a 20 y.o. male who presents to the Emergency Department complaining of severe, ongoing, productive cough onset 07/22/15. Associated Sx include intermittent fever, SOB, tonsillar swelling, and one episode of hemoptysis (blood was mixed in with phlegm). Pt reports taking OTC meds including tylenol and eliquis without improvement.  Past Medical History  Diagnosis Date  . Asthma    Past Surgical History  Procedure Laterality Date  . Nerve, tendon and artery repair Right 08/14/2013    Procedure:  REPAIR OF INDEX FINGER DIGITAL NERVE AND REPAIR OF LONG FINGER DIGITAL NERVE AND ARTERY. REPAIR OF INDEX FLEXOR TENDON.;  Surgeon: Tami Ribas, MD;  Location: [facility] OR;  Service: Orthopedics;  Laterality: Right;   No family history on file. Social History  Substance Use Topics  . Smoking status: Never Smoker   . Smokeless tobacco: None  . Alcohol Use: No    Review of Systems A complete 10 system review of systems was obtained and all systems are negative except as noted in the HPI and PMH.  Allergies  Review of patient's allergies indicates no known allergies.  Home Medications   Prior to Admission medications   Medication Sig Start Date End Date Taking? Authorizing Provider  acetaminophen (TYLENOL) 325 MG tablet Take 650 mg by mouth every 6 (six) hours as needed for moderate pain.   Yes Historical Provider, MD  albuterol (PROVENTIL HFA) 108 (90 Base) MCG/ACT inhaler Inhale 2 puffs into the lungs every 6 (six) hours as needed  for wheezing or shortness of breath.   Yes Historical Provider, MD                guaiFENesin-codeine 100-10 MG/5ML syrup Take 5-10 mLs by mouth every 6 (six) hours as needed for cough. 07/31/15   Kristen N Ward, DO  ibuprofen (ADVIL,MOTRIN) 800 MG tablet Take 1 tablet (800 mg total) by mouth every 8 (eight) hours as needed for mild pain. Patient not taking: Reported on 08/02/2015 07/31/15   Layla Maw Ward, DO   Triage Vitals: BP 103/86 mmHg  Pulse 85  Temp(Src) 98.6 F (37 C) (Oral)  Resp 20  Ht  (1.753 m)  Wt 170 lb (77.111 kg)  BMI 25.09 kg/m2  SpO2 98% Physical Exam  Constitutional: He is oriented to person, place, and time. He appears well-developed and well-nourished.  HENT:  Head: Normocephalic and atraumatic.  Eyes: EOM are normal.  Neck: Normal range of motion.  Cardiovascular: Normal rate, regular rhythm, normal heart sounds and intact distal pulses.   Pulmonary/Chest: Effort normal. No respiratory distress. He has wheezes (bilaterally). He has no rhonchi.  Abdominal: Soft. He exhibits no distension. There is no tenderness.  Musculoskeletal: Normal range of motion.  Neurological: He is alert and oriented to person, place, and time.  Skin: Skin is warm and dry.  Psychiatric: He has a normal mood and affect. Judgment normal.  Nursing note and vitals reviewed.   ED Course  Procedures (including critical care time) DIAGNOSTIC STUDIES: Oxygen Saturation is  98% on ra, nl by my interpretation.    COORDINATION OF CARE: 12:29 PM: Discussed treatment plan which includes breathing Tx, antibiotics, albuterol inhaler with pt at bedside; patient verbalizes understanding and agrees with treatment plan.  Labs Review Labs Reviewed  CBC WITH DIFFERENTIAL/PLATELET - Abnormal; Notable for the following:    WBC 17.2 (*)    Neutro Abs 12.9 (*)    Monocytes Absolute 1.6 (*)    All other components within normal limits  COMPREHENSIVE METABOLIC PANEL   Imaging Review Dg Chest 2  View  08/02/2015  CLINICAL DATA:  Cough and congestion for 1 week EXAM: CHEST  2 VIEW COMPARISON:  07/31/2015 FINDINGS: Cardiac shadow is stable. The lungs are again well aerated. Minimal atelectatic changes/ early infiltrate are noted in the right middle lobe anteriorly. These were not present on the prior exam. No bony abnormality is seen. IMPRESSION: Early infiltrate/ atelectasis in the right middle lobe. This is new from the prior exam. Electronically Signed   By: Alcide Clever M.D.   On: 08/02/2015 12:46   I have personally reviewed and evaluated these images and lab results as part of my medical decision-making.  MDM   Final diagnoses:  CAP (community acquired pneumonia)    Pt started on azithro but did not fill it. Will need to fill the azithromycin and will need amox as well. Albuterol for cough. HR improved. Breathing improved   I personally performed the services described in this documentation, which was scribed in my presence. The recorded information has been reviewed and is accurate.        Azalia Bilis, MD 08/02/15 [phone]

## 2017-05-20 ENCOUNTER — Ambulatory Visit: Payer: Self-pay | Admitting: Internal Medicine

## 2017-05-22 ENCOUNTER — Encounter (HOSPITAL_COMMUNITY): Payer: Self-pay | Admitting: Emergency Medicine

## 2017-05-22 ENCOUNTER — Emergency Department (HOSPITAL_COMMUNITY): Payer: Self-pay

## 2017-05-22 ENCOUNTER — Emergency Department (HOSPITAL_COMMUNITY)
Admission: EM | Admit: 2017-05-22 | Discharge: 2017-05-22 | Discharge status: Against Medical Advice | Payer: Self-pay | Attending: Physician Assistant | Admitting: Physician Assistant

## 2017-05-22 DIAGNOSIS — R0981 Nasal congestion: Secondary | ICD-10-CM | POA: Insufficient documentation

## 2017-05-22 DIAGNOSIS — J45901 Unspecified asthma with (acute) exacerbation: Secondary | ICD-10-CM | POA: Insufficient documentation

## 2017-05-22 DIAGNOSIS — R059 Cough, unspecified: Secondary | ICD-10-CM

## 2017-05-22 DIAGNOSIS — R05 Cough: Secondary | ICD-10-CM

## 2017-05-22 DIAGNOSIS — F17210 Nicotine dependence, cigarettes, uncomplicated: Secondary | ICD-10-CM | POA: Insufficient documentation

## 2017-05-22 LAB — CBC WITH DIFFERENTIAL/PLATELET
Basophils Absolute: 0 10*3/uL (ref 0.0–0.1)
Basophils Relative: 0 %
EOS ABS: 0 10*3/uL (ref 0.0–0.7)
Eosinophils Relative: 0 %
HCT: 42.2 % (ref 39.0–52.0)
HEMOGLOBIN: 14.7 g/dL (ref 13.0–17.0)
LYMPHS ABS: 2.1 10*3/uL (ref 0.7–4.0)
LYMPHS PCT: 11 %
MCH: 30.5 pg (ref 26.0–34.0)
MCHC: 34.8 g/dL (ref 30.0–36.0)
MCV: 87.6 fL (ref 78.0–100.0)
MONOS PCT: 10 %
Monocytes Absolute: 2 10*3/uL — ABNORMAL HIGH (ref 0.1–1.0)
NEUTROS PCT: 79 %
Neutro Abs: 14.8 10*3/uL — ABNORMAL HIGH (ref 1.7–7.7)
Platelets: 270 10*3/uL (ref 150–400)
RBC: 4.82 MIL/uL (ref 4.22–5.81)
RDW: 12.3 % (ref 11.5–15.5)
WBC: 19 10*3/uL — ABNORMAL HIGH (ref 4.0–10.5)

## 2017-05-22 LAB — BASIC METABOLIC PANEL
Anion gap: 9 (ref 5–15)
BUN: 6 mg/dL (ref 6–20)
CHLORIDE: 101 mmol/L (ref 101–111)
CO2: 24 mmol/L (ref 22–32)
CREATININE: 1.2 mg/dL (ref 0.61–1.24)
Calcium: 8.6 mg/dL — ABNORMAL LOW (ref 8.9–10.3)
GFR calc Af Amer: >60 mL/min (ref >60)
GFR calc non Af Amer: >60 mL/min (ref >60)
GLUCOSE: 126 mg/dL — AB (ref 65–99)
Potassium: 3.3 mmol/L — ABNORMAL LOW (ref 3.5–5.1)
SODIUM: 134 mmol/L — AB (ref 135–145)

## 2017-05-22 LAB — RAPID STREP SCREEN (MED CTR MEBANE ONLY): Streptococcus, Group A Screen (Direct): NEGATIVE

## 2017-05-22 LAB — URINALYSIS, ROUTINE W REFLEX MICROSCOPIC
BILIRUBIN URINE: NEGATIVE
GLUCOSE, UA: NEGATIVE mg/dL
Hgb urine dipstick: NEGATIVE
KETONES UR: NEGATIVE mg/dL
LEUKOCYTES UA: NEGATIVE
NITRITE: NEGATIVE
PH: 7 (ref 5.0–8.0)
Protein, ur: NEGATIVE mg/dL
SPECIFIC GRAVITY, URINE: 1.003 — AB (ref 1.005–1.030)

## 2017-05-22 LAB — INFLUENZA PANEL BY PCR (TYPE A & B)
INFLAPCR: NEGATIVE
Influenza B By PCR: NEGATIVE

## 2017-05-22 MED ORDER — SODIUM CHLORIDE 0.9 % IV BOLUS (SEPSIS)
1000.0000 mL | Freq: Once | INTRAVENOUS | Status: AC
  Administered 2017-05-22: 1000 mL via INTRAVENOUS

## 2017-05-22 MED ORDER — METHOCARBAMOL 500 MG PO TABS
500.0000 mg | ORAL_TABLET | Freq: Once | ORAL | Status: AC
  Administered 2017-05-22: 500 mg via ORAL
  Filled 2017-05-22: qty 1

## 2017-05-22 MED ORDER — ALBUTEROL SULFATE HFA 108 (90 BASE) MCG/ACT IN AERS: 1.0000 | INHALATION_SPRAY | Freq: Four times a day (QID) | RESPIRATORY_TRACT | 0 refills | Status: AC | PRN

## 2017-05-22 MED ORDER — IPRATROPIUM-ALBUTEROL 0.5-2.5 (3) MG/3ML IN SOLN
3.0000 mL | Freq: Once | RESPIRATORY_TRACT | Status: AC
  Administered 2017-05-22: 3 mL via RESPIRATORY_TRACT
  Filled 2017-05-22: qty 3

## 2017-05-22 MED ORDER — AZITHROMYCIN 250 MG PO TABS
500.0000 mg | ORAL_TABLET | Freq: Once | ORAL | Status: AC
  Administered 2017-05-22: 500 mg via ORAL
  Filled 2017-05-22: qty 2

## 2017-05-22 MED ORDER — ALBUTEROL (5 MG/ML) CONTINUOUS INHALATION SOLN
10.0000 mg/h | INHALATION_SOLUTION | Freq: Once | RESPIRATORY_TRACT | Status: DC
  Filled 2017-05-22: qty 2

## 2017-05-22 MED ORDER — POTASSIUM CHLORIDE CRYS ER 20 MEQ PO TBCR
30.0000 meq | EXTENDED_RELEASE_TABLET | Freq: Once | ORAL | Status: AC
  Administered 2017-05-22: 10:00:00 30 meq via ORAL
  Filled 2017-05-22: qty 1

## 2017-05-22 MED ORDER — AZITHROMYCIN 250 MG PO TABS: ORAL_TABLET | ORAL | 0 refills | Status: DC

## 2017-05-22 MED ORDER — METHYLPREDNISOLONE SODIUM SUCC 125 MG IJ SOLR
125.0000 mg | Freq: Once | INTRAMUSCULAR | Status: AC
  Administered 2017-05-22: 125 mg via INTRAVENOUS
  Filled 2017-05-22: qty 2

## 2017-05-22 MED ORDER — PREDNISONE 20 MG PO TABS: 40.0000 mg | ORAL_TABLET | Freq: Every day | ORAL | 0 refills | Status: DC

## 2017-05-22 NOTE — ED Notes (Signed)
Patient transported to X-ray 

## 2017-05-22 NOTE — ED Notes (Signed)
Pt returned from XR. Pt on monitor resting comfortably

## 2017-05-22 NOTE — ED Provider Notes (Signed)
[facility] EMERGENCY DEPARTMENT Provider Note   CSN: [id number] Arrival date & time: 05/22/17  9528     History   Chief Complaint Chief Complaint  Patient presents with  . Nasal Congestion  . Leg Pain  . Cough  . Sore Throat    HPI [name] is a 21 y.o. male with past medical history of asthma who presents the emergency department today for 9-day history of URI symptoms.  He states that he had subjective fever and chills that began 9 days ago.  This lasted for 2 days before it "broke".  He then started having nasal congestion, postnasal drip, sore throat, and productive cough with green sputum.  He notes that several days ago he did have some blood-tinged sputum but no gross blood.  No current hemoptysis.  He has been using over-the-counter Tamiflu for this without much relief.  Over the last several days the patient has been having chest tightness, wheezing and shortness of breath.  He states that he has been using his albuterol inhaler at home but recently ran out.  He is unsure if this was helping much.  Last night the patient started having muscle spasms in his legs including his thigh, hamstring and calves.  This is occurring bilaterally.  His symptoms have now resolved and he states that he has lower extremity body aches today.  Patient has been diagnosed with pneumonia in the past.  He denies prior hospitalizations for pneumonia or asthma.  He denies any headache, neck stiffness, chest pain, night sweats, weight loss, travel, trauma, immobilization, history of blood clot, or cancer. Patient does smoke and vape. Drug use includes marijuana.   HPI  Past Medical History:  Diagnosis Date  . Asthma     Patient Active Problem List   Diagnosis Date Noted  . Unspecified asthma(493.90) 01/26/2014    Past Surgical History:  Procedure Laterality Date  . NERVE, TENDON AND ARTERY REPAIR Right 08/14/2013   Procedure:  REPAIR OF INDEX FINGER DIGITAL NERVE AND  REPAIR OF LONG FINGER DIGITAL NERVE AND ARTERY. REPAIR OF INDEX FLEXOR TENDON.;  Surgeon: Tami Ribas, MD;  Location: [facility] OR;  Service: Orthopedics;  Laterality: Right;       Home Medications    Prior to Admission medications   Medication Sig Start Date End Date Taking? Authorizing Provider  acetaminophen (TYLENOL) 325 MG tablet Take 650 mg by mouth every 6 (six) hours as needed for moderate pain.    [provider]  albuterol (PROVENTIL HFA) 108 (90 Base) MCG/ACT inhaler Inhale 2 puffs into the lungs every 6 (six) hours as needed for wheezing or shortness of breath.    [provider]  amoxicillin (AMOXIL) 500 MG capsule Take 2 capsules (1,000 mg total) by mouth 2 (two) times daily. 08/02/15   Azalia Bilis, MD  azithromycin (ZITHROMAX) 250 MG tablet Take 1 tablet (250 mg total) by mouth daily. For four more days 08/02/15   Azalia Bilis, MD  guaiFENesin-codeine 100-10 MG/5ML syrup Take 5-10 mLs by mouth every 6 (six) hours as needed for cough. 07/31/15   Ward, Layla Maw, DO  ibuprofen (ADVIL,MOTRIN) 800 MG tablet Take 1 tablet (800 mg total) by mouth every 8 (eight) hours as needed for mild pain. Patient not taking: Reported on 08/02/2015 07/31/15   Ward, Layla Maw, DO    Family History No family history on file.  Social History Social History   Tobacco Use  . Smoking status: Current Every Day Smoker  .  Smokeless tobacco: Current User  Substance Use Topics  . Alcohol use: Yes  . Drug use: No     Allergies   Patient has no known allergies.   Review of Systems Review of Systems  All other systems reviewed and are negative.    Physical Exam Updated Vital Signs BP (!) 145/80 (BP Location: Right Arm)   Pulse (!) 106   Temp 99 F (37.2 C)   Resp 20   Ht 5\' 9"  (1.753 m)   Wt 83.9 kg (185 lb)   SpO2 96%   BMI 27.32 kg/m   Physical Exam  Constitutional: He appears well-developed and well-nourished.  HENT:  Head: Normocephalic and atraumatic.  Right  Ear: Hearing, tympanic membrane, external ear and ear canal normal.  Left Ear: Hearing, tympanic membrane, external ear and ear canal normal.  Nose: Mucosal edema and rhinorrhea present. Right sinus exhibits no maxillary sinus tenderness and no frontal sinus tenderness. Left sinus exhibits no maxillary sinus tenderness and no frontal sinus tenderness.  Mouth/Throat: Uvula is midline and oropharynx is clear and moist. Mucous membranes are dry. No tonsillar exudate.  The patient has normal phonation and is in control of secretions. No stridor.  Midline uvula without edema. Soft palate rises symmetrically.  Mild tonsillar erythema without exudates. No PTA.  Cobblestoning tongue protrusion is normal. No trismus. No creptius on neck palpation and patient has good dentition. No gingival erythema or fluctuance noted. Mucus membranes moist.   Eyes: Pupils are equal, round, and reactive to light. Right eye exhibits no discharge. Left eye exhibits no discharge. No scleral icterus.  Neck: Trachea normal. Neck supple. No spinous process tenderness present. No neck rigidity. Normal range of motion present.  No meningismus  Cardiovascular: Normal rate, regular rhythm and intact distal pulses.  No murmur heard. Pulses:      Radial pulses are 2+ on the right side, and 2+ on the left side.       Dorsalis pedis pulses are 2+ on the right side, and 2+ on the left side.       Posterior tibial pulses are 2+ on the right side, and 2+ on the left side.  No lower extremity swelling or edema. Calves symmetric in size bilaterally.  Pulmonary/Chest: Effort normal. No respiratory distress. He has wheezes (diffuse insp and exp). He has rhonchi in the right middle field and the right lower field. He exhibits no tenderness.  Abdominal: Soft. Bowel sounds are normal. There is no tenderness. There is no rebound and no guarding.  Musculoskeletal: He exhibits no edema.  Lymphadenopathy:    He has no cervical adenopathy.    Neurological: He is alert.  Skin: Skin is warm. No rash noted. He is diaphoretic.  Psychiatric: He has a normal mood and affect.  Nursing note and vitals reviewed.    ED Treatments / Results  Labs (all labs ordered are listed, but only abnormal results are displayed) Labs Reviewed  RAPID STREP SCREEN (NOT AT [facility])  CBC WITH DIFFERENTIAL/PLATELET  BASIC METABOLIC PANEL  INFLUENZA PANEL BY PCR (TYPE A & B)    EKG  EKG Interpretation None       Radiology No results found.  Procedures Procedures (including critical care time)  Medications Ordered in ED Medications  sodium chloride 0.9 % bolus 1,000 mL (not administered)  potassium chloride (K-DUR,KLOR-CON) CR tablet 30 mEq (not administered)  albuterol (PROVENTIL,VENTOLIN) solution continuous neb (not administered)  azithromycin (ZITHROMAX) tablet 500 mg (not administered)  ipratropium-albuterol (DUONEB) 0.5-2.5 (3)  MG/3ML nebulizer solution 3 mL (3 mLs Nebulization Given 05/22/17 0808)  sodium chloride 0.9 % bolus 1,000 mL (0 mLs Intravenous Stopped 05/22/17 0902)  methocarbamol (ROBAXIN) tablet 500 mg (500 mg Oral Given 05/22/17 0851)  ipratropium-albuterol (DUONEB) 0.5-2.5 (3) MG/3ML nebulizer solution 3 mL (3 mLs Nebulization Given 05/22/17 0906)  methylPREDNISolone sodium succinate (SOLU-MEDROL) 125 mg/2 mL injection 125 mg (125 mg Intravenous Given 05/22/17 0906)     Initial Impression / Assessment and Plan / ED Course  I have reviewed the triage vital signs and the nursing notes.  Pertinent labs & imaging results that were available during my care of the patient were reviewed by me and considered in my medical decision making (see chart for details).     21 year old male with URI symptoms leading to chest tightness and wheezing.  He is out of albuterol inhaler at home.  He has audible wheeze presentation.  He is noted to be tachycardic and diaphoretic.  Patient is without fever, tachypnea, hypoxia or  hypotension.  On exam the patient has diffuse wheezing with rhonchi in the right middle and lower lobe.  Patient also appears to have dry mucous membranes.  He is also stating that he has muscle spasms/body aches in the lower extremity.  Will give patient a breathing treatment, steroids, IV fluid, and muscle relaxer for tx. Will obtain CBC, BMP, flu test, strep test, CXR and EKG (for tachycardia) to evaluate.   Flu test negative.  Strep test negative.  Throat exam without evidence of PTA.  CBC with leukocytosis and shift.  Otherwise reassuring.  BMP with mild hypokalemia.  Will place orally.  Chest x-ray without active cardiopulmonary disease.  EKG without ischemic changes.   After breathing treatment patient states breathing improved but he still feels mildly short of breath.  He still has diffuse wheezing and appears to have consolidation in the right lower lung.  Although the chest x-ray was normal I clinically feel the patient may have early pneumonia, especially with a elevated white blood cell count with shift and will treat him with azithromycin.  Will give the patient a second round of DuoNeb.  Patient feels pain and muscle spasm in the lower leg are improving after muscle relaxer.  After second DuoNeb patient feels breathing has improved but still has diffuse wheezing.  Would like to treat with continuous albuterol times 1 hour however patient is refusing.  He states that he would not like to stay for treatment and would like to sign out AMA. We discussed the nature and purpose, risks and benefits, as well as, the alternatives of treatment. Time was given to allow the opportunity to ask questions and consider their options, and after the discussion, the patient decided to refuse the offerred treatment. The patient was informed that refusal could lead to, but was not limited to, death, permanent disability, or severe pain. If present, I asked the relatives or significant others to dissuade them without  success. Prior to refusing, I determined that the patient had the capacity to make their decision and understood the consequences of that decision. After refusal, I made every reasonable opportunity to treat them to the best of my ability.  Provided the patient with a albuterol inhaler, azithromycin and steroid burst.  Advised the patient that he is to follow-up with his PCP in the next 3 days or return to the emergency department for worsening symptoms.  Advised strict return precautions.  The patient was notified that they may return to the emergency  department at any time for further treatment.    Patient case seen and discussed with Dr. Corlis Leak who is in agreement with plan.   Final Clinical Impressions(s) / ED Diagnoses   Final diagnoses:  Exacerbation of asthma, unspecified asthma severity, unspecified whether persistent  Cough  Nasal congestion    ED Discharge Orders        Ordered    predniSONE (DELTASONE) 20 MG tablet  Daily     05/22/17 1032    azithromycin (ZITHROMAX) 250 MG tablet     05/22/17 1032    albuterol (PROVENTIL HFA;VENTOLIN HFA) 108 (90 Base) MCG/ACT inhaler  Every 6 hours PRN     05/22/17 1032       Jacinto Halim, [place] 05/22/17 1453    Abelino Derrick, MD 05/22/17 1512

## 2017-05-22 NOTE — Discharge Instructions (Signed)
You were seen here today for symptoms related to your known Asthma.   Please follow-up with your doctor in regards to your asthma exacerbation in the next 3 days for further evaluation and management of your asthma. Read the instructions below to learn more about factors that can trigger asthma. Use your albuterol inhaler as directed.  Also be giving a steroid burst.  Please take this as directed.  This medication can cause irritability, weight gain, increased hunger and difficulty sleeping.  Also prescribing you an antibiotic for possible underlying lung infection.  Please take all of your antibiotics until finished!   You may develop abdominal discomfort or diarrhea from the antibiotic.  You may help offset this with probiotics which you can buy or get in yogurt. Do not eat or take the probiotics until 2 hours after your antibiotic. Do not take your medicine if develop an itchy rash, swelling in your mouth or lips, or difficulty breathing. Your more than welcome to return to the emergency department if you become short of breath, your albuterol inhaler does not relieve symptoms, you are having chest pain associated with difficulty breathing, or any symptoms that are concerning to you. For congestion pick up over the counter mucinex.  Make sure to stay well-hydrated with taking this medication as it thins mucus.   IDENTIFY AND CONTROL FACTORS THAT MAKE YOUR ASTHMA WORSE A number of common things can set off or make your asthma symptoms worse (asthma triggers). Keep track of your asthma symptoms for several weeks, detailing all the environmental and emotional factors that are linked with your asthma. When you have an asthma attack, go back to your asthma diary to see which factor, or combination of factors, might have contributed to it. Once you know what these factors are, you can take steps to control many of them.  Allergies: If you have allergies and asthma, it is important to take asthma prevention steps  at home. Asthma attacks (worsening of asthma symptoms) can be triggered by allergies, which can cause temporary increased inflammation of your airways. Minimizing contact with the substance to which you are allergic will help prevent an asthma attack. Animal Dander:  Some people are allergic to the flakes of skin or dried saliva from animals with fur or feathers. Keep these pets out of your home.  If you can't keep a pet outdoors, keep the pet out of your bedroom and other sleeping areas at all times, and keep the door closed.  Remove carpets and furniture covered with cloth from your home. If that is not possible, keep the pet away from fabric-covered furniture and carpets.  Dust Mites: Many people with asthma are allergic to dust mites. Dust mites are tiny bugs that are found in every home, in mattresses, pillows, carpets, fabric-covered furniture, bedcovers, clothes, stuffed toys, fabric, and other fabric-covered items.  Cover your mattress in a special dust-proof cover.  Cover your pillow in a special dust-proof cover, or wash the pillow each week in hot water. Water must be hotter than 130 F to kill dust mites. Cold or warm water used with detergent and bleach can also be effective.  Wash the sheets and blankets on your bed each week in hot water.  Try not to sleep or lie on cloth-covered cushions.  Call ahead when traveling and ask for a smoke-free hotel room. Bring your own bedding and pillows, in case the hotel only supplies feather pillows and down comforters, which may contain dust mites and cause asthma  symptoms.  Remove carpets from your bedroom and those laid on concrete, if you can.  Keep stuffed toys out of the bed, or wash the toys weekly in hot water or cooler water with detergent and bleach.  Cockroaches: Many people with asthma are allergic to the droppings and remains of cockroaches.  Keep food and garbage in closed containers. Never leave food out.  Use poison baits, traps,  powders, gels, or paste (for example, boric acid).  If a spray is used to kill cockroaches, stay out of the room until the odor goes away.  Indoor Mold: Fix leaky faucets, pipes, or other sources of water that have mold around them.  Clean moldy surfaces with a cleaner that has bleach in it.  Pollen and Outdoor Mold: When pollen or mold spore counts are high, try to keep your windows closed.  Stay indoors with windows closed from late morning to afternoon, if you can. Pollen and some mold spore counts are highest at that time.  Ask your caregiver whether you need to take or increase anti-inflammatory medicine before your allergy season starts.  Irritants:  Tobacco smoke is an irritant. If you smoke, ask your caregiver how you can quit. Ask family members to quit smoking, too. Do not allow smoking in your home or car.  If possible, do not use a wood-burning stove, kerosene heater, or fireplace. Minimize exposure to all sources of smoke, including incense, candles, fires, and fireworks.  Try to stay away from strong odors and sprays, such as perfume, talcum powder, hair spray, and paints.  Decrease humidity in your home and use an indoor air cleaning device. Reduce indoor humidity to below 60 percent. Dehumidifiers or central air conditioners can do this.  Try to have someone else vacuum for you once or twice a week, if you can. Stay out of rooms while they are being vacuumed and for a short while afterward.  If you vacuum, use a dust mask from a hardware store, a double-layered or microfilter vacuum cleaner bag, or a vacuum cleaner with a HEPA filter.  Sulfites in foods and beverages can be irritants. Do not drink beer or wine, or eat dried fruit, processed potatoes, or shrimp if they cause asthma symptoms.  Cold air can trigger an asthma attack. Cover your nose and mouth with a scarf on cold or windy days.  Several health conditions can make asthma more difficult to manage, including runny nose,  sinus infections, reflux disease, psychological stress, and sleep apnea. Your caregiver will treat these conditions, as well.  Avoid close contact with people who have a cold or the flu, since your asthma symptoms may get worse if you catch the infection from them. Wash your hands thoroughly after touching items that may have been handled by people with a respiratory infection.  Get a flu shot every year to protect against the flu virus, which often makes asthma worse for days or weeks. Also get a pneumonia shot once every five to 10 years.  Drugs: Aspirin and other painkillers can cause asthma attacks. 10% to 20% of people with asthma have sensitivity to aspirin or a group of painkillers called non-steroidal anti-inflammatory drugs (NSAIDS), such as ibuprofen and naproxen. These drugs are used to treat pain and reduce fevers. Asthma attacks caused by any of these medicines can be severe and even fatal. These drugs must be avoided in people who have known aspirin sensitive asthma. Products with acetaminophen are considered safe for people who have asthma. It is  important that people with aspirin sensitivity read labels of all over-the-counter drugs used to treat pain, colds, coughs, and fever.  Beta blockers and ACE inhibitors are other drugs which you should discuss with your caregiver, in relation to your asthma.   EXERCISE  If you have exercise-induced asthma, or are planning vigorous exercise, or exercise in cold, humid, or dry environments, prevent exercise-induced asthma by following your caregiver's advice regarding asthma treatment before exercising. Additional Information:  Your vital signs today were: BP 131/90    Pulse (!) 108    Temp 99 F (37.2 C)    Resp 13    Ht 5\' 9"  (1.753 m)    Wt 83.9 kg (185 lb)    SpO2 97%    BMI 27.32 kg/m  If your blood pressure (BP) was elevated above 135/85 this visit, please have this repeated by your doctor within one month. ---------------

## 2017-05-22 NOTE — Progress Notes (Signed)
Ledell NossBrandy Brown, RRT attempted to start continuous neb treatment and patient refused. RN and MD aware

## 2017-05-22 NOTE — ED Triage Notes (Signed)
Pt. Stated, [name] Atlasve been sick with cold, congestion, cough, chills, and leg spasms for 9 days. Nothing has helped.

## 2017-05-24 LAB — CULTURE, GROUP A STREP (THRC)

## 2017-11-26 ENCOUNTER — Emergency Department (HOSPITAL_COMMUNITY)
Admission: EM | Admit: 2017-11-26 | Discharge: 2017-11-26 | Disposition: A | Discharge status: Home / Self Care | Payer: Self-pay | Attending: Emergency Medicine | Admitting: Emergency Medicine

## 2017-11-26 ENCOUNTER — Other Ambulatory Visit: Payer: Self-pay

## 2017-11-26 ENCOUNTER — Encounter (HOSPITAL_COMMUNITY): Payer: Self-pay | Admitting: Emergency Medicine

## 2017-11-26 DIAGNOSIS — J40 Bronchitis, not specified as acute or chronic: Secondary | ICD-10-CM

## 2017-11-26 DIAGNOSIS — F17200 Nicotine dependence, unspecified, uncomplicated: Secondary | ICD-10-CM | POA: Insufficient documentation

## 2017-11-26 DIAGNOSIS — J029 Acute pharyngitis, unspecified: Secondary | ICD-10-CM

## 2017-11-26 LAB — GROUP A STREP BY PCR: GROUP A STREP BY PCR: NOT DETECTED

## 2017-11-26 MED ORDER — AZITHROMYCIN 250 MG PO TABS: 250.0000 mg | ORAL_TABLET | Freq: Every day | ORAL | 0 refills | Status: AC

## 2017-11-26 MED ORDER — PREDNISONE 20 MG PO TABS: 40.0000 mg | ORAL_TABLET | Freq: Every day | ORAL | 0 refills | Status: AC

## 2017-11-26 NOTE — Discharge Instructions (Addendum)
Use your inhaler 2 puffs every 4 hours.  Take Zithromax as prescribed until all gone.  Take prednisone as prescribed until all gone.  Salt water gargles.  Tylenol Motrin for pain.  Follow-up with family doctor.

## 2017-11-26 NOTE — ED Provider Notes (Signed)
[facility]-EMERGENCY DEPT Provider Note   CSN: [id number] Arrival date & time: 11/26/17  1429     History   Chief Complaint Chief Complaint  Patient presents with  . Cough    HPI [name] is a 22 y.o. male.  HPI [name] is a 22 y.o. male with history of asthma, current smoker, presents to emergency department complaining of cough and sore throat.  Patient states symptoms began as a cold, states started 8 days ago.  Initially had fever which now resolved.  He states he is still coughing and having severe sore throat.  He states that it is painful and very difficult to swallow.  He is using inhaler every few hours for wheezing which helps some.  He denies any sick contacts.  He denies any neck pain or stiffness.  No headache.  Nothing making his symptoms better or worse.  No other complaints.  Past Medical History:  Diagnosis Date  . Asthma     Patient Active Problem List   Diagnosis Date Noted  . Unspecified asthma(493.90) 01/26/2014    Past Surgical History:  Procedure Laterality Date  . NERVE, TENDON AND ARTERY REPAIR Right 08/14/2013   Procedure:  REPAIR OF INDEX FINGER DIGITAL NERVE AND REPAIR OF LONG FINGER DIGITAL NERVE AND ARTERY. REPAIR OF INDEX FLEXOR TENDON.;  Surgeon: Tami Ribas, MD;  Location: [facility] OR;  Service: Orthopedics;  Laterality: Right;        Home Medications    Prior to Admission medications   Medication Sig Start Date End Date Taking? Authorizing Provider  acetaminophen (TYLENOL) 325 MG tablet Take 650 mg by mouth every 6 (six) hours as needed for moderate pain.    [provider]  albuterol (PROVENTIL HFA;VENTOLIN HFA) 108 (90 Base) MCG/ACT inhaler Inhale 1-2 puffs every 6 (six) hours as needed into the lungs for wheezing or shortness of breath. 05/22/17   Maczis, Elmer Sow, PA-C  amoxicillin (AMOXIL) 500 MG capsule Take 2 capsules (1,000 mg total) by mouth 2 (two) times daily. Patient not  taking: Reported on 05/22/2017 08/02/15   Azalia Bilis, MD  azithromycin [facility]) 250 MG tablet Take 2 tabs PO x 1 dose, then 1 tab PO QD x 4 days 05/22/17   Jacinto Halim, PA-C  diphenhydramine-acetaminophen (TYLENOL PM) 25-500 MG TABS tablet Take 1 tablet at bedtime as needed by mouth (pain).    [provider]  guaiFENesin (MUCINEX) 600 MG 12 hr tablet Take 600 mg 2 (two) times daily as needed by mouth for cough.    [provider]  guaiFENesin-codeine 100-10 MG/5ML syrup Take 5-10 mLs by mouth every 6 (six) hours as needed for cough. Patient not taking: Reported on 05/22/2017 07/31/15   Ward, Layla Maw, DO  ibuprofen (ADVIL,MOTRIN) 800 MG tablet Take 1 tablet (800 mg total) by mouth every 8 (eight) hours as needed for mild pain. 07/31/15   Ward, Layla Maw, DO  predniSONE (DELTASONE) 20 MG tablet Take 2 tablets (40 mg total) daily by mouth. 05/22/17   Maczis, Elmer Sow, PA-C  Pseudoephedrine-APAP-DM (DAYQUIL MULTI-SYMPTOM COLD/FLU PO) Take 1 capsule daily as needed by mouth (cold).    [provider]    Family History No family history on file.  Social History Social History   Tobacco Use  . Smoking status: Current Every Day Smoker  . Smokeless tobacco: Current User  Substance Use Topics  . Alcohol use: Yes  . Drug use: No  Allergies   Patient has no known allergies.   Review of Systems Review of Systems  Constitutional: Positive for chills and fever.  HENT: Positive for congestion and sore throat.   Respiratory: Positive for cough. Negative for chest tightness and shortness of breath.   Cardiovascular: Negative for chest pain, palpitations and leg swelling.  Gastrointestinal: Negative for abdominal distention, abdominal pain, diarrhea, nausea and vomiting.  Genitourinary: Negative for dysuria, frequency, hematuria and urgency.  Musculoskeletal: Negative for arthralgias, myalgias, neck pain and neck stiffness.  Skin: Negative for rash.    Allergic/Immunologic: Negative for immunocompromised state.  Neurological: Negative for dizziness, weakness, light-headedness, numbness and headaches.  All other systems reviewed and are negative.    Physical Exam Updated Vital Signs BP (!) 134/94 (BP Location: Left Arm)   Pulse 74   Temp 97.9 F (36.6 C) (Oral)   Resp 16   SpO2 98%   Physical Exam  Constitutional: He is oriented to person, place, and time. He appears well-developed and well-nourished. No distress.  HENT:  Head: Normocephalic and atraumatic.  Right Ear: External ear normal.  Left Ear: External ear normal.  Mouth/Throat: Oropharynx is clear and moist.  Clear rhinorrhea, pharynx erythemous, uvula midline  Eyes: Conjunctivae are normal.  Neck: Normal range of motion. Neck supple.  No meningeal signs  Cardiovascular: Normal rate, regular rhythm and normal heart sounds.  Pulmonary/Chest: Effort normal. No respiratory distress. He has wheezes. He has no rales.  Abdominal: Soft. Bowel sounds are normal. There is no tenderness.  Musculoskeletal: He exhibits no edema or tenderness.  Lymphadenopathy:    He has no cervical adenopathy.  Neurological: He is alert and oriented to person, place, and time.  Skin: Skin is warm and dry. No erythema.  Psychiatric: He has a normal mood and affect.  Nursing note and vitals reviewed.    ED Treatments / Results  Labs (all labs ordered are listed, but only abnormal results are displayed) Labs Reviewed  GROUP A STREP BY PCR    EKG None  Radiology No results found.  Procedures Procedures (including critical care time)  Medications Ordered in ED Medications - No data to display   Initial Impression / Assessment and Plan / ED Course  I have reviewed the triage vital signs and the nursing notes.  Pertinent labs & imaging results that were available during my care of the patient were reviewed by me and considered in my medical decision making (see chart for  details).     Patient with a history of asthma, current smoker, here for cough for 8 days with sore throat.  Vital signs here are normal.  Oropharynx is erythematous, however no exudate, uvula midline, no concern for peritonsillar abscess.  Rapid strep obtained and is negative.  Patient does have wheezing, he refused an inhaler or breathing treatment here stating that he has his own inhaler with him and he will just use that.  His oxygen saturation is 100% on room air.  Given worsening cough for over a week, history of asthma, and increased sputum production, will place patient on Z-Pak.  I will also give him a short burst of steroids.  Continue inhaler every 4 hours.  Follow-up with family doctor.  Vitals:   11/26/17 1438 11/26/17 1523  BP: (!) 134/94 129/85  Pulse: 74 89  Resp: 16 17  Temp: 97.9 F (36.6 C) 98.2 F (36.8 C)  TempSrc: Oral Oral  SpO2: 98% 100%     Final Clinical Impressions(s) / ED Diagnoses  Final diagnoses:  Bronchitis  Pharyngitis, unspecified etiology    ED Discharge Orders        Ordered    azithromycin (ZITHROMAX) 250 MG tablet  Daily     11/26/17 1640    predniSONE (DELTASONE) 20 MG tablet  Daily     11/26/17 1640       Jaynie Crumble, PA-C 11/26/17 1641    Wynetta Fines, MD 11/26/17 2139

## 2019-06-05 ENCOUNTER — Emergency Department (HOSPITAL_COMMUNITY): Payer: Self-pay

## 2019-06-05 ENCOUNTER — Other Ambulatory Visit: Payer: Self-pay

## 2019-06-05 ENCOUNTER — Emergency Department (HOSPITAL_COMMUNITY)
Admission: EM | Admit: 2019-06-05 | Discharge: 2019-06-05 | Disposition: A | Discharge status: Home / Self Care | Payer: Self-pay | Attending: Emergency Medicine | Admitting: Emergency Medicine

## 2019-06-05 ENCOUNTER — Encounter (HOSPITAL_COMMUNITY): Payer: Self-pay

## 2019-06-05 DIAGNOSIS — F17200 Nicotine dependence, unspecified, uncomplicated: Secondary | ICD-10-CM | POA: Insufficient documentation

## 2019-06-05 DIAGNOSIS — M545 Low back pain, unspecified: Secondary | ICD-10-CM

## 2019-06-05 DIAGNOSIS — F17228 Nicotine dependence, chewing tobacco, with other nicotine-induced disorders: Secondary | ICD-10-CM | POA: Insufficient documentation

## 2019-06-05 DIAGNOSIS — J45909 Unspecified asthma, uncomplicated: Secondary | ICD-10-CM | POA: Insufficient documentation

## 2019-06-05 MED ORDER — LIDOCAINE 5 % EX PTCH: 1.0000 | MEDICATED_PATCH | CUTANEOUS | 0 refills | Status: AC

## 2019-06-05 MED ORDER — OXYCODONE-ACETAMINOPHEN 5-325 MG PO TABS: 1.0000 | ORAL_TABLET | Freq: Four times a day (QID) | ORAL | 0 refills | Status: AC | PRN

## 2019-06-05 MED ORDER — METHOCARBAMOL 500 MG PO TABS: 500.0000 mg | ORAL_TABLET | Freq: Two times a day (BID) | ORAL | 0 refills | Status: DC

## 2019-06-05 MED ORDER — OXYCODONE-ACETAMINOPHEN 5-325 MG PO TABS
2.0000 | ORAL_TABLET | Freq: Once | ORAL | Status: AC
  Administered 2019-06-05: 2 via ORAL
  Filled 2019-06-05: qty 2

## 2019-06-05 MED ORDER — IBUPROFEN 400 MG PO TABS
600.0000 mg | ORAL_TABLET | Freq: Once | ORAL | Status: AC
  Administered 2019-06-05: 600 mg via ORAL
  Filled 2019-06-05: qty 1

## 2019-06-05 MED ORDER — IBUPROFEN 600 MG PO TABS: 600.0000 mg | ORAL_TABLET | Freq: Four times a day (QID) | ORAL | 0 refills | Status: AC | PRN

## 2019-06-05 NOTE — ED Triage Notes (Signed)
[place] [facility] [place] for eval of back pain s/p "hard landing" on his feet. Pt reports lower/mid back pain radiates around to R side. Neurovascularly intact, however feels as though he cannot walk d/t pain. Pt was able to stand and pivot from EMS stretcher to wheelchair. GCS 15, no other s/sx of trauma. C-collar on as precaution, denies cervical tenderness or injury

## 2019-06-05 NOTE — ED Notes (Signed)
Patient Alert and oriented to baseline. Stable and ambulatory to baseline. Patient verbalized understanding of the discharge instructions.  Patient belongings were taken by the patient.   

## 2019-06-05 NOTE — ED Notes (Signed)
Pt able to ambulate in hallway. 

## 2019-06-05 NOTE — ED Notes (Signed)
Patient transported to CT 

## 2019-06-05 NOTE — Discharge Instructions (Addendum)
Expect your soreness to increase over the next 2-3 days. Take it easy, but do not lay around too much as this may make any stiffness worse.  Antiinflammatory medications: Take 600 mg of ibuprofen every 6 hours or 440 mg (over the counter dose) to 500 mg (prescription dose) of naproxen every 12 hours for the next 3 days. After this time, these medications may be used as needed for pain. Take these medications with food to avoid upset stomach. Choose only one of these medications, do not take them together. Acetaminophen (generic for Tylenol): Should you continue to have additional pain while taking the ibuprofen or naproxen, you may add in acetaminophen as needed. Your daily total maximum amount of acetaminophen from all sources should be limited to 4000mg /day for persons without liver problems, or 2000mg /day for those with liver problems. Percocet: May take Percocet (oxycodone-acetaminophen) as needed for severe pain.   Do not drive or perform other dangerous activities while taking this medication as it can cause drowsiness as well as changes in reaction time and judgement.  Please note that each pill of Percocet contains 325 mg of acetaminophen (generic for Tylenol) and the above dosage limits apply. Methocarbamol: Methocarbamol (generic for Robaxin) is a muscle relaxer and can help relieve stiff muscles or muscle spasms.  Do not drive or perform other dangerous activities while taking this medication as it can cause drowsiness as well as changes in reaction time and judgement. Lidocaine patches: These are available via either prescription or over-the-counter. The over-the-counter option may be more economical one and are likely just as effective. There are multiple over-the-counter brands, such as Salonpas. Exercises: Be sure to perform the attached exercises starting with three times a week and working up to performing them daily. This is an essential part of preventing long term problems.  Follow up:  Follow-up with an orthopedic specialist within the next couple weeks.  This should be done regardless if pain has resolved as there were abnormalities noted on the CT scan that will need to be addressed by the orthopedic specialist. Return: Return to the ED should symptoms worsen.  For prescription assistance, may try using prescription discount sites or apps, such as goodrx.com

## 2019-06-05 NOTE — ED Provider Notes (Signed)
[name] EMERGENCY DEPARTMENT Provider Note   CSN: [id number] Arrival date & time: 06/05/19  1744     History   Chief Complaint Chief Complaint  Patient presents with  . Back Pain    HPI [name] is a 23 y.o. male.     HPI   [name] is a 22 y.o. male, with a history of asthma, presenting to the ED with back pain beginning just prior to arrival.  Patient was skating with his skateboard, jumped with the skateboard onto a low box about a foot high, his skateboard caught on the box, causing patient's forward motion to stop abruptly. The symptoms he landed on both feet, he had severe back pain in the lower back, sharp, radiating toward the thoracic spine.  Denies head injury, LOC, neck pain, numbness, weakness, abdominal pain, chest pain, or any other complaints.    Past Medical History:  Diagnosis Date  . Asthma     Patient Active Problem List   Diagnosis Date Noted  . Unspecified asthma(493.90) 01/26/2014    Past Surgical History:  Procedure Laterality Date  . NERVE, TENDON AND ARTERY REPAIR Right 08/14/2013   Procedure:  REPAIR OF INDEX FINGER DIGITAL NERVE AND REPAIR OF LONG FINGER DIGITAL NERVE AND ARTERY. REPAIR OF INDEX FLEXOR TENDON.;  Surgeon: Tami Ribas, MD;  Location: [facility] OR;  Service: Orthopedics;  Laterality: Right;        Home Medications    Prior to Admission medications   Medication Sig Start Date End Date Taking? Authorizing Provider  acetaminophen (TYLENOL) 325 MG tablet Take 650 mg by mouth every 6 (six) hours as needed for moderate pain.    [provider]  albuterol (PROVENTIL HFA;VENTOLIN HFA) 108 (90 Base) MCG/ACT inhaler Inhale 1-2 puffs every 6 (six) hours as needed into the lungs for wheezing or shortness of breath. 05/22/17   Maczis, Elmer Sow, PA-C  azithromycin (ZITHROMAX) 250 MG tablet Take 1 tablet (250 mg total) by mouth daily. Take first 2 tablets together, then 1 every day until  finished. 11/26/17   Kirichenko, Lemont Fillers, PA-C  diphenhydramine-acetaminophen (TYLENOL PM) 25-500 MG TABS tablet Take 1 tablet at bedtime as needed by mouth (pain).    [provider]  guaiFENesin (MUCINEX) 600 MG 12 hr tablet Take 600 mg 2 (two) times daily as needed by mouth for cough.    [provider]  ibuprofen (ADVIL) 600 MG tablet Take 1 tablet (600 mg total) by mouth every 6 (six) hours as needed. 06/05/19   [place] Flett C, PA-C  lidocaine (LIDODERM) 5 % Place 1 patch onto the skin daily. Remove & Discard patch within 12 hours or as directed by MD 06/05/19   Vincie Linn C, PA-C  methocarbamol (ROBAXIN) 500 MG tablet Take 1 tablet (500 mg total) by mouth 2 (two) times daily. 06/05/19   Jassiel Flye C, PA-C  oxyCODONE-acetaminophen (PERCOCET/ROXICET) 5-325 MG tablet Take 1-2 tablets by mouth every 6 (six) hours as needed for severe pain. 06/05/19   Cherry Wittwer C, PA-C  predniSONE (DELTASONE) 20 MG tablet Take 2 tablets (40 mg total) by mouth daily. 11/26/17   Kirichenko, Tatyana, PA-C  Pseudoephedrine-APAP-DM (DAYQUIL MULTI-SYMPTOM COLD/FLU PO) Take 1 capsule daily as needed by mouth (cold).    [provider]    Family History History reviewed. No pertinent family history.  Social History Social History   Tobacco Use  . Smoking status: Current Every Day Smoker  . Smokeless tobacco: Current User  Substance Use Topics  . Alcohol use: Yes  . Drug use: No     Allergies   Patient has no known allergies.   Review of Systems Review of Systems  Respiratory: Negative for shortness of breath.   Cardiovascular: Negative for chest pain.  Gastrointestinal: Negative for abdominal pain, nausea and vomiting.  Musculoskeletal: Positive for back pain. Negative for arthralgias and neck pain.  Neurological: Negative for weakness and numbness.  All other systems reviewed and are negative.    Physical Exam Updated Vital Signs BP 102/82 (BP Location: Right Arm)   Pulse  70   Temp 97.6 F (36.4 C) (Oral)   Resp (!) 22   Ht 5\' 9"  (1.753 m)   Wt 83.9 kg   SpO2 98%   BMI 27.32 kg/m   Physical Exam Vitals signs and nursing note reviewed.  Constitutional:      General: He is not in acute distress.    Appearance: He is well-developed. He is not diaphoretic.  HENT:     Head: Normocephalic and atraumatic.     Mouth/Throat:     Mouth: Mucous membranes are moist.     Pharynx: Oropharynx is clear.  Eyes:     Conjunctiva/sclera: Conjunctivae normal.  Neck:     Musculoskeletal: Neck supple.     Comments: No pain or tenderness in the cervical spine.  Patient has full range of motion side to side and up and down without pain or noted difficulty. Cardiovascular:     Rate and Rhythm: Normal rate and regular rhythm.     Pulses: Normal pulses.          Radial pulses are 2+ on the right side and 2+ on the left side.       Posterior tibial pulses are 2+ on the right side and 2+ on the left side.     Comments: Tactile temperature in the extremities appropriate and equal bilaterally. Pulmonary:     Effort: Pulmonary effort is normal. No respiratory distress.     Breath sounds: Normal breath sounds.  Abdominal:     Palpations: Abdomen is soft.     Tenderness: There is no abdominal tenderness. There is no guarding.  Musculoskeletal:       Back:     Right lower leg: No edema.     Left lower leg: No edema.     Comments: Tenderness in the region of the spine indicated without noted deformity, step-off, swelling, or color change. No noted pain or tenderness to the chest, hips, or upper or lower extremities.  Skin:    General: Skin is warm and dry.  Neurological:     Mental Status: He is alert.     Comments: Sensation grossly intact to light touch in the extremities.  Grip strengths equal bilaterally.  Strength 5/5 in all extremities.  Slow, but stable gait. Coordination intact. Cranial nerves III-XII grossly intact. No facial droop.   Psychiatric:        Mood  and Affect: Mood and affect normal.        Speech: Speech normal.        Behavior: Behavior normal.      ED Treatments / Results  Labs (all labs ordered are listed, but only abnormal results are displayed) Labs Reviewed - No data to display  EKG None  Radiology Dg Thoracic Spine 2 View  Result Date: 06/05/2019 CLINICAL DATA:  23 year old male with trauma and back pain. EXAM: THORACIC SPINE 2 VIEWS COMPARISON:  Chest  radiograph dated 05/22/2017. FINDINGS: There is no evidence of thoracic spine fracture. Alignment is normal. No other significant bone abnormalities are identified. IMPRESSION: Negative. Electronically Signed   By: Elgie Collard M.D.   On: 06/05/2019 18:34   Dg Lumbar Spine 2-3 Views  Result Date: 06/05/2019 CLINICAL DATA:  23 year old male with history of trauma from a fall while skateboarding. Low back pain. EXAM: LUMBAR SPINE - 2-3 VIEW COMPARISON:  Lumbar spine radiograph 03/03/2012. FINDINGS: There is no evidence of lumbar spine fracture. Alignment is normal. Intervertebral disc spaces are maintained. IMPRESSION: Negative. Electronically Signed   By: Trudie Reed M.D.   On: 06/05/2019 18:32   Ct Thoracic Spine Wo Contrast  Result Date: 06/05/2019 CLINICAL DATA:  Back pain after [place] skate park EXAM: CT THORACIC SPINE WITHOUT CONTRAST TECHNIQUE: Multidetector CT images of the thoracic were obtained using the standard protocol without intravenous contrast. COMPARISON:  Thoracic radiography from earlier today FINDINGS: Alignment: Normal Vertebrae: No acute fracture or aggressive process. There is sclerosis in the anterior aspect of the T9 vertebral body. No generalized corner sclerosis to implicate inflammatory process. No bridging osteophytes. Chronic endplate irregularities from Schmorl's nodes or noted cortical remnants. Paraspinal and other soft tissues: Negative for hematoma. Disc levels: No visible herniation or cord/foraminal impingement IMPRESSION: No  evidence of acute thoracic spine injury. Electronically Signed   By: Marnee Spring M.D.   On: 06/05/2019 20:39   Ct Lumbar Spine Wo Contrast  Result Date: 06/05/2019 CLINICAL DATA:  Back pain after hard landing at [employer] park. EXAM: CT LUMBAR SPINE WITHOUT CONTRAST TECHNIQUE: Multidetector CT imaging of the lumbar spine was performed without intravenous contrast administration. Multiplanar CT image reconstructions were also generated. COMPARISON:  None. FINDINGS: Segmentation: 5 lumbar type vertebrae Alignment: Normal Vertebrae: No acute fracture or focal pathologic process. Paraspinal and other soft tissues: No evidence of injury. Disc levels: T12- L1: Unremarkable. L1-L2: Unremarkable. L2-L3: Mild disc narrowing and bulging. L3-L4: Disc narrowing and bulging with a central disc protrusion that deforms the ventral thecal sac. L4-L5: Disc narrowing with small central protrusion slightly deforming the ventral thecal sac. L5-S1:Unremarkable. IMPRESSION: 1. Negative for acute fracture. 2. L3-4 and L4-5 disc narrowing with central protrusion. Electronically Signed   By: Marnee Spring M.D.   On: 06/05/2019 20:51    Procedures Procedures (including critical care time)  Medications Ordered in ED Medications  ibuprofen (ADVIL) tablet 600 mg (600 mg Oral Given 06/05/19 1936)  oxyCODONE-acetaminophen (PERCOCET/ROXICET) 5-325 MG per tablet 2 tablet (2 tablets Oral Given 06/05/19 1936)     Initial Impression / Assessment and Plan / ED Course  I have reviewed the triage vital signs and the nursing notes.  Pertinent labs & imaging results that were available during my care of the patient were reviewed by me and considered in my medical decision making (see chart for details).        Patient presents with back pain.  No focal neurologic deficits.  No evidence of vascular compromise. No acute abnormalities on imaging studies.  Disc narrowing and protrusion discussed with patient as well as the  importance for follow-up.  Due to the lack of neurologic deficits, we will have the patient follow-up in the office. Patient was able to ambulate without assistance or noted difficulty. The patient was given instructions for home care as well as return precautions. Patient voices understanding of these instructions, accepts the plan, and is comfortable with discharge.  Findings and plan of care discussed with Tilden Fossa, MD.  Final Clinical Impressions(s) / ED Diagnoses   Final diagnoses:  Acute midline low back pain without sciatica    ED Discharge Orders         Ordered    oxyCODONE-acetaminophen (PERCOCET/ROXICET) 5-325 MG tablet  Every 6 hours PRN     06/05/19 2145    methocarbamol (ROBAXIN) 500 MG tablet  2 times daily     06/05/19 2145    lidocaine (LIDODERM) 5 %  Every 24 hours     06/05/19 2145    ibuprofen (ADVIL) 600 MG tablet  Every 6 hours PRN     06/05/19 2145           Anselm PancoastJoy, Roshonda Sperl C, PA-C 06/06/19 0146    Tilden Fossaees, Elizabeth, MD 06/06/19 1304

## 2019-06-05 NOTE — ED Notes (Signed)
ED Provider at bedside. 

## 2019-06-05 NOTE — ED Notes (Signed)
Pt returned from CT, nad noted 

## 2021-08-20 IMAGING — CT CT T SPINE W/O CM
4 of 6 series · 16 of 33 positions shown, 18 images · non-contrast
Comparison: Thoracic radiography from earlier today

CLINICAL DATA: Back pain after injury at skate park

EXAM:
CT THORACIC SPINE WITHOUT CONTRAST
TECHNIQUE: Multidetector CT images of the thoracic were obtained using the
standard protocol without intravenous contrast.

[Series 6: t spine soft · axial · 0.37mm/px · z∈[+942,+1150]mm · 5 of 156 slices shown]
[im 26/156  soft-tissue]
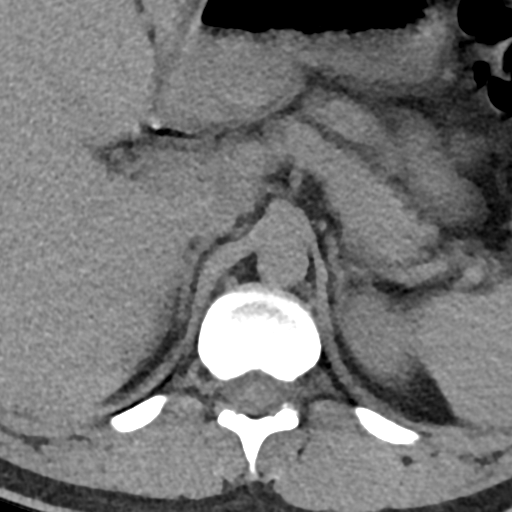
[im 52/156  soft-tissue]
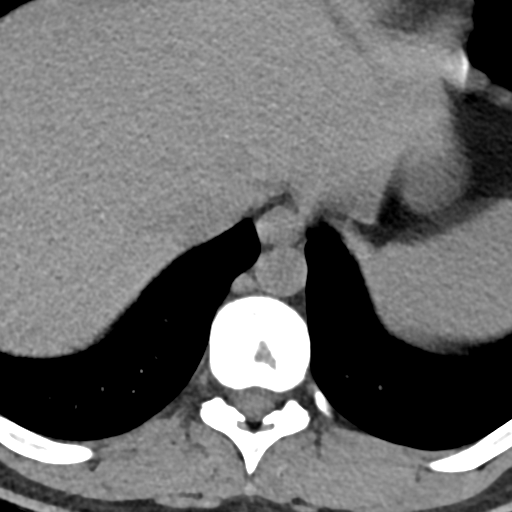
[im 78/156  soft-tissue]
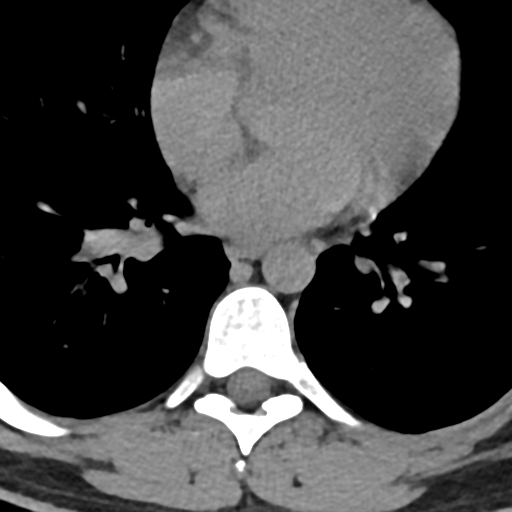
[im 104/156  soft-tissue]
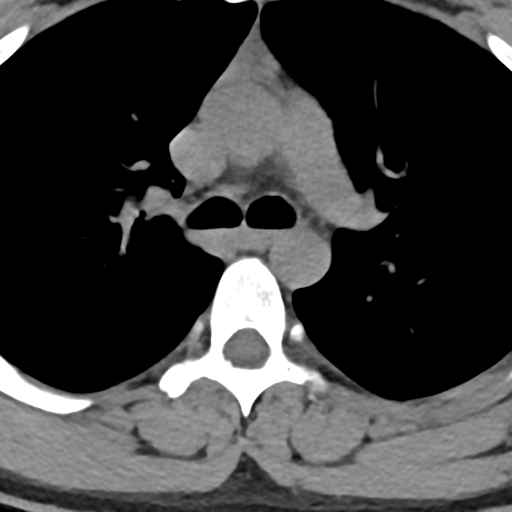
[im 130/156  soft-tissue]
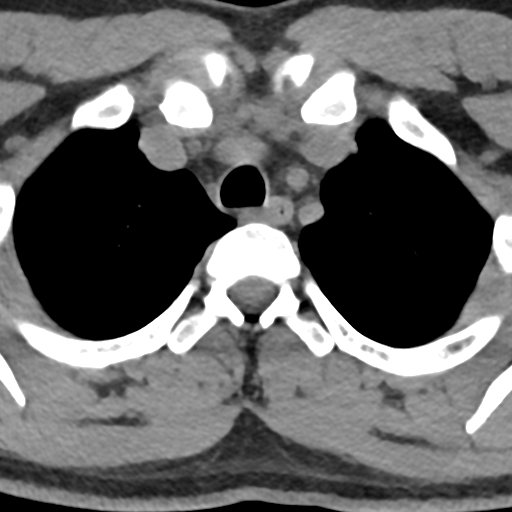

[Series 8: cor bone · coronal · 0.35mm/px · 1 of 133 slices shown]
[im 67/133  bone]
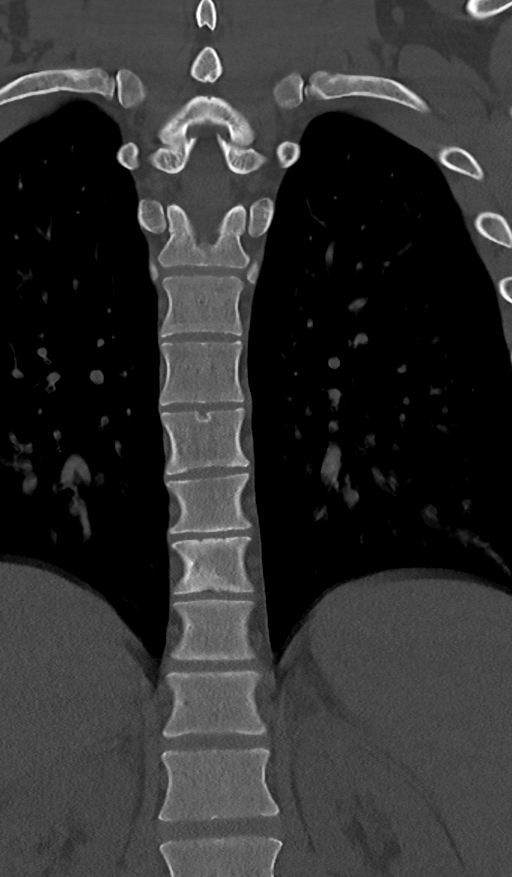

[Series 9: orthogonal bone · axial · 0.21mm/px · z∈[+944,+1139]mm · 5 of 160 slices shown, 7 images]
[im 27/160  soft-tissue]
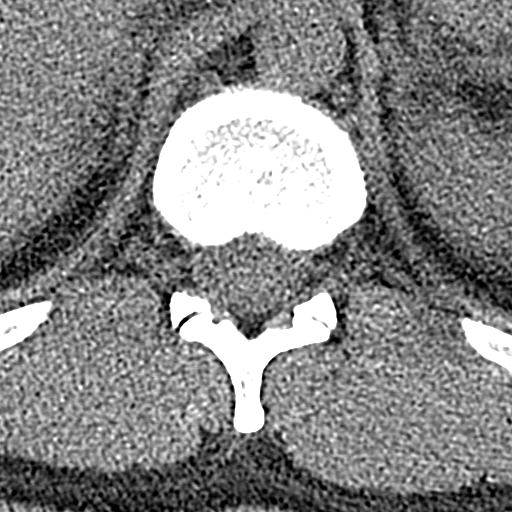
[im 27/160  bone]
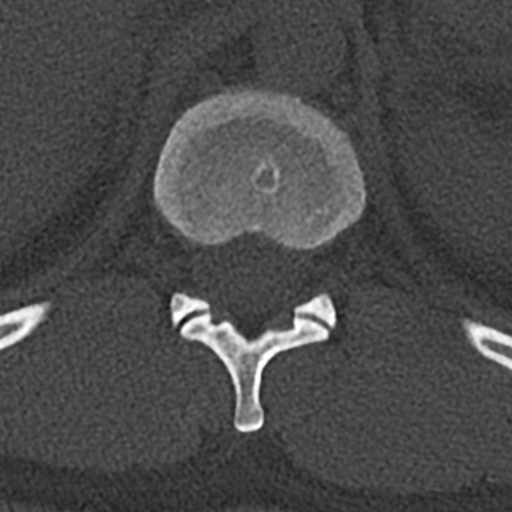
[im 54/160  bone]
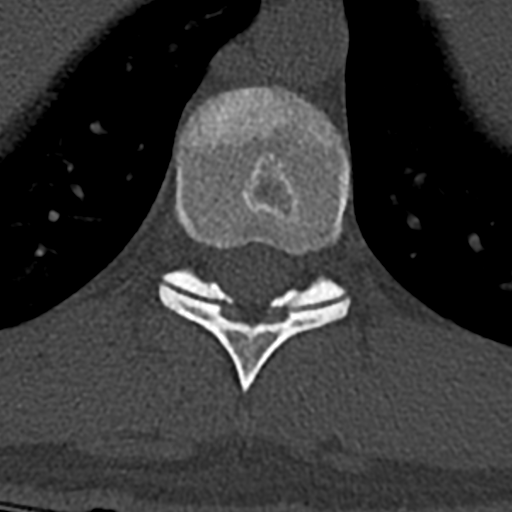
[im 80/160  bone]
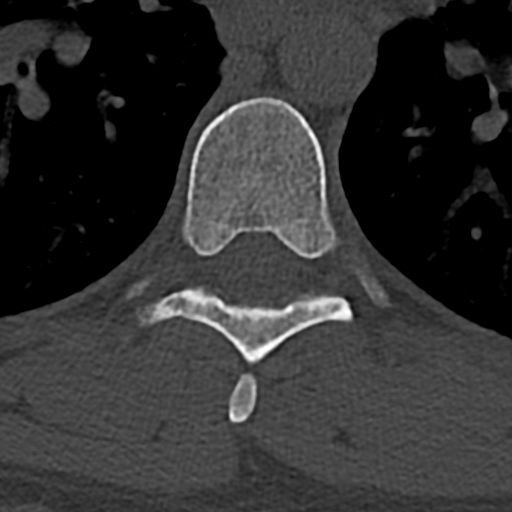
[im 107/160  bone]
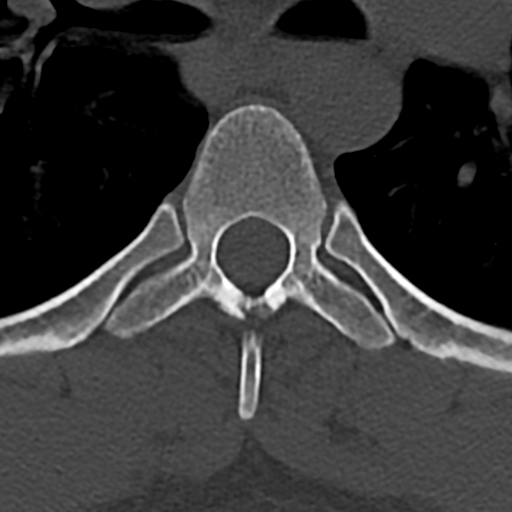
[im 133/160  soft-tissue]
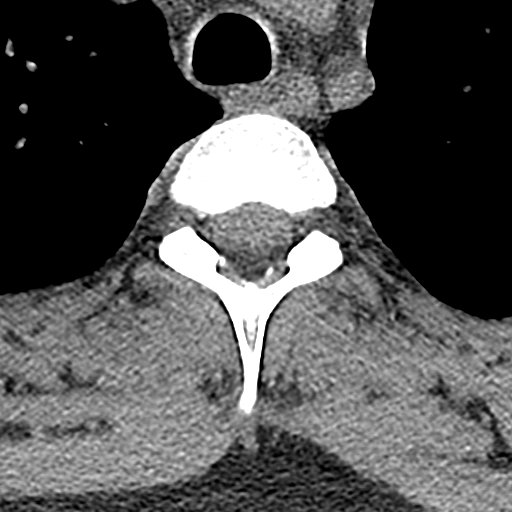
[im 133/160  bone]
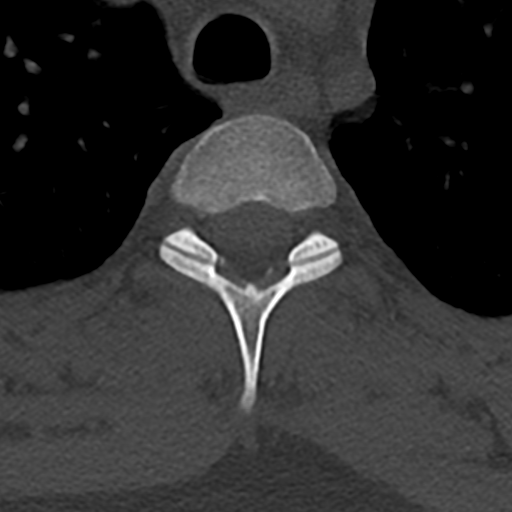

[Series 11: sag st · sagittal · 0.52mm/px · 5 of 239 slices shown]
[im 60/239  bone]
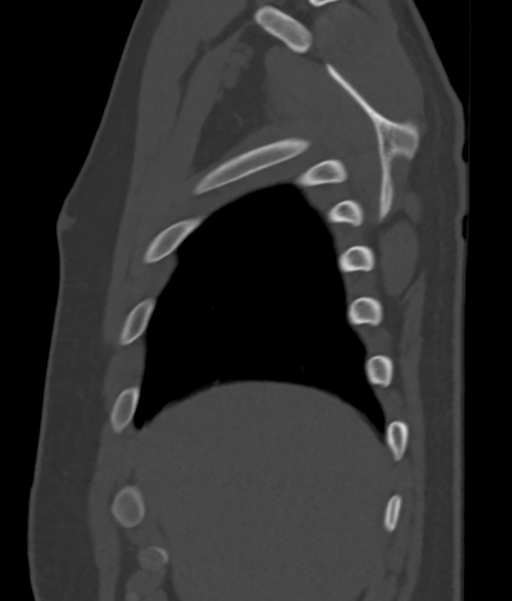
[im 90/239  bone]
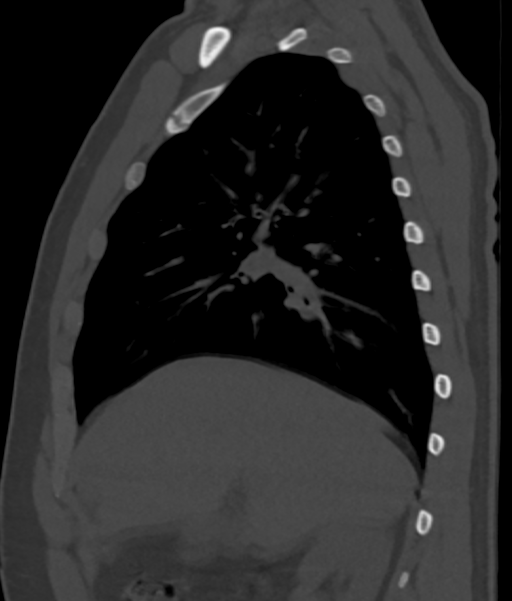
[im 120/239  bone]
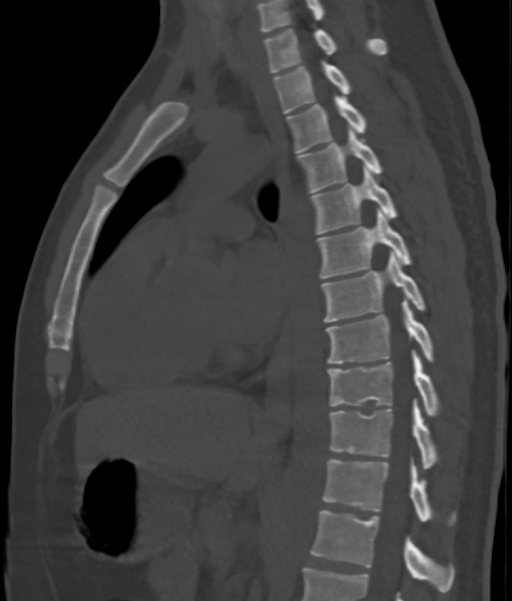
[im 149/239  bone]
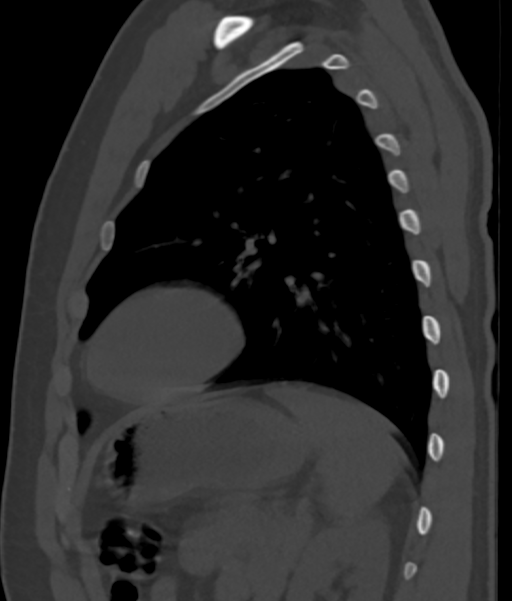
[im 179/239  bone]
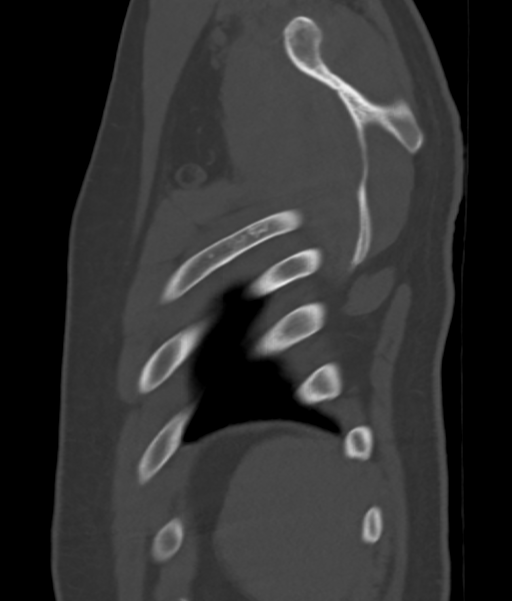

[16 of 33 positions shown; findings below may reference images not displayed]

FINDINGS: Alignment: Normal

Vertebrae: No acute fracture or aggressive process. There is
sclerosis in the anterior aspect of the T9 vertebral body. No
generalized corner sclerosis to implicate inflammatory process. No
bridging osteophytes. Chronic endplate irregularities from Schmorl's
nodes or noted cortical remnants.

Paraspinal and other soft tissues: Negative for hematoma.

Disc levels: No visible herniation or cord/foraminal impingement
IMPRESSION: No evidence of acute thoracic spine injury.

## 2021-08-20 IMAGING — CR DG LUMBAR SPINE 2-3V
3 series · 3 of 3 positions shown · non-contrast
Comparison: Lumbar spine radiograph 03/03/2012.

CLINICAL DATA: 23-year-old male with history of trauma from a fall
while skateboarding. Low back pain.

EXAM:
LUMBAR SPINE - 2-3 VIEW

[l-spine ap]
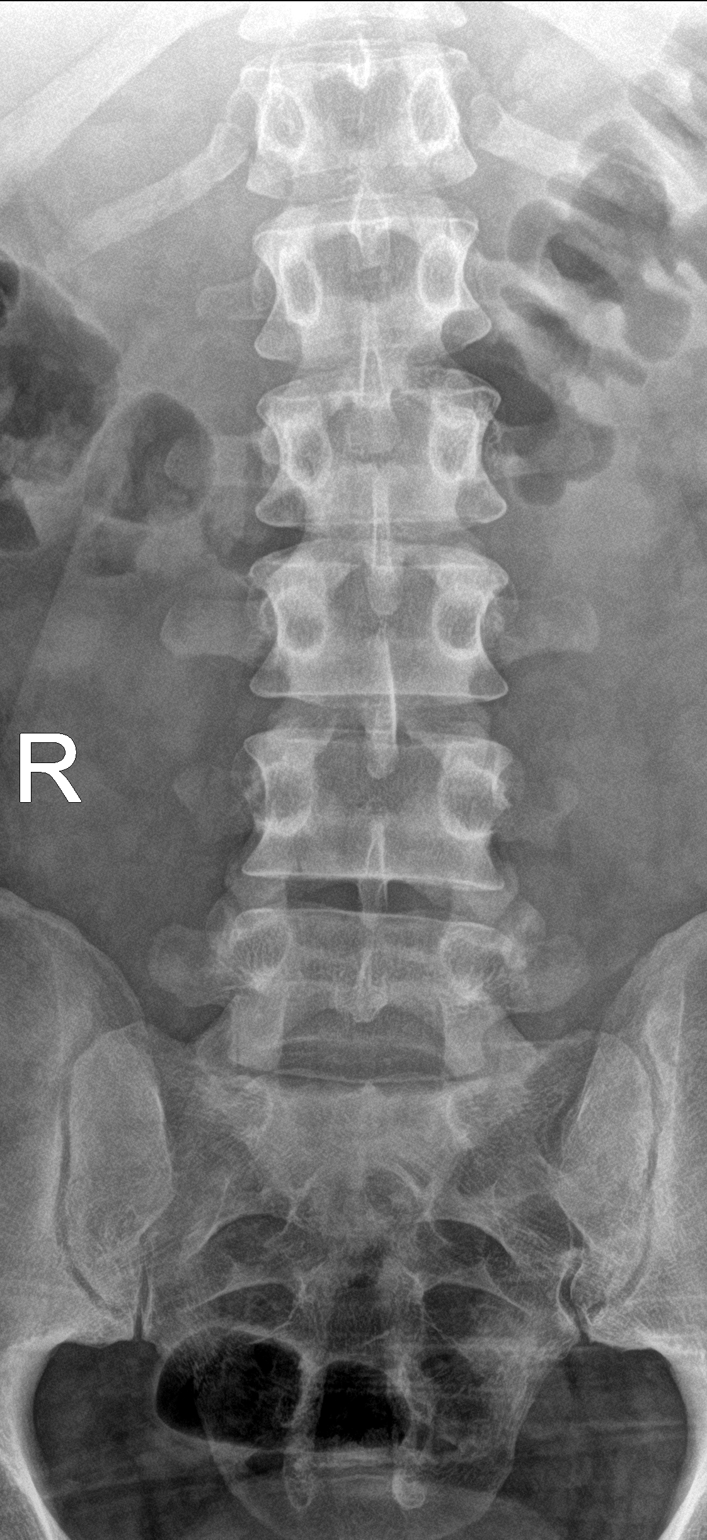

[l-spine lat]
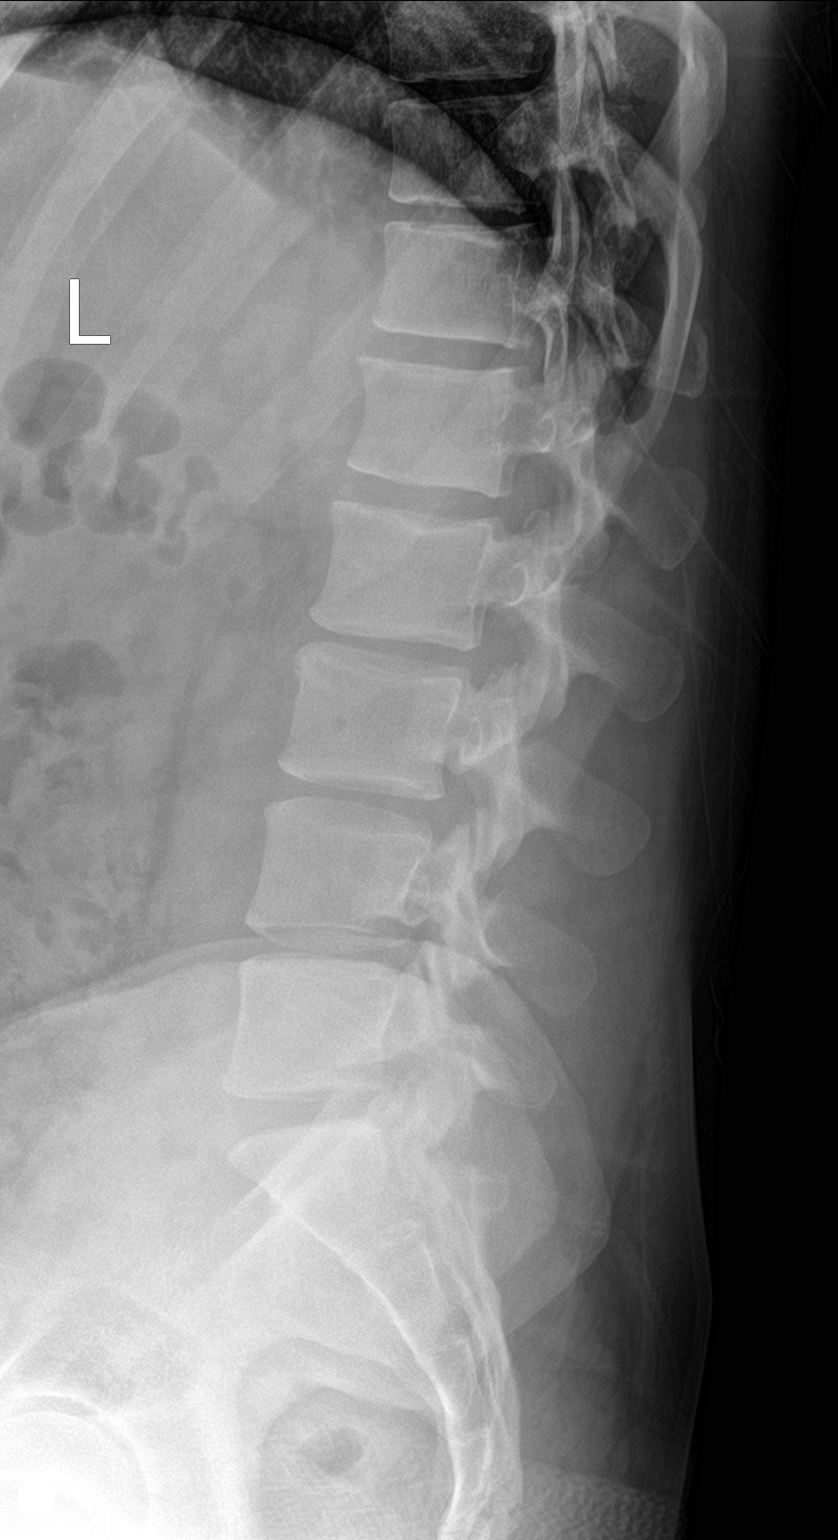

[l-spine spot]
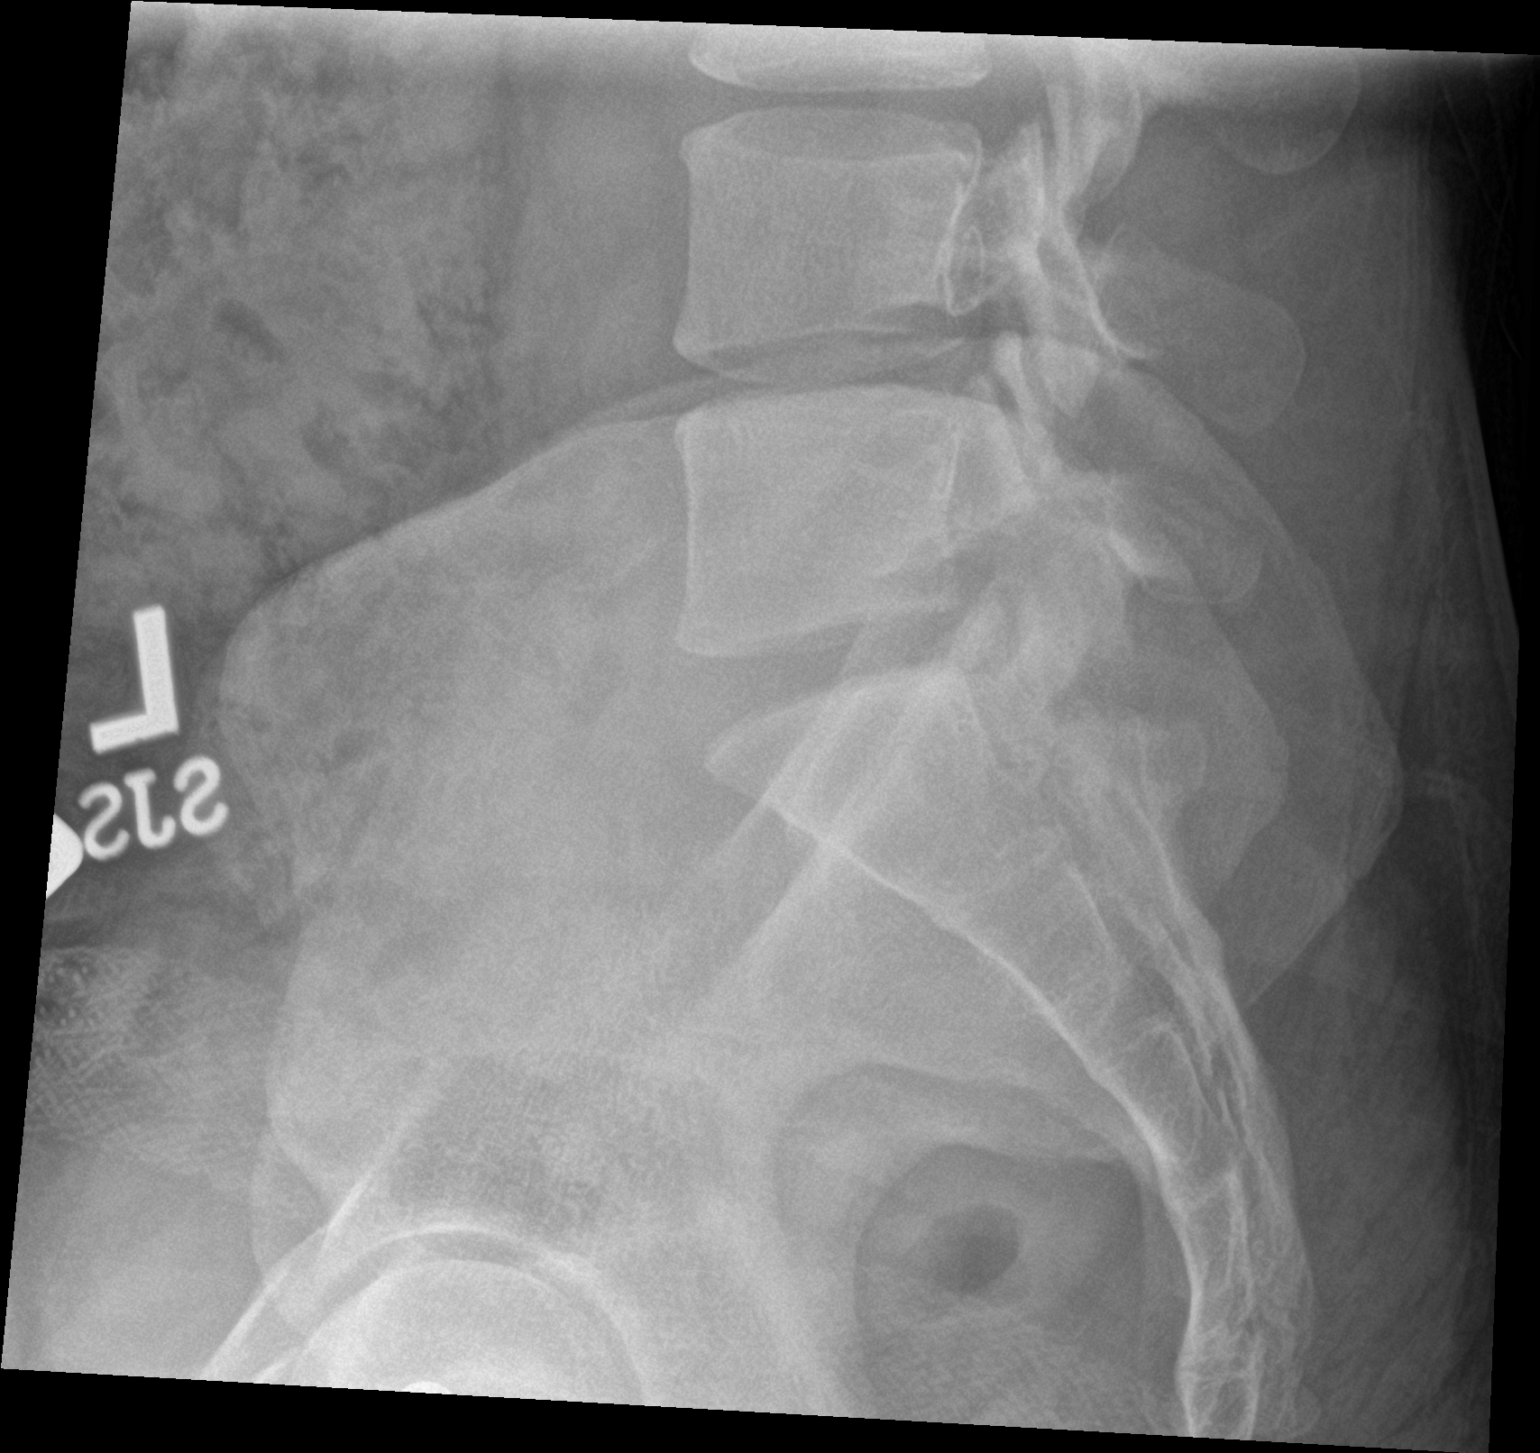

[3 of 3 positions shown; findings below may reference images not displayed]

FINDINGS: There is no evidence of lumbar spine fracture. Alignment is normal.
Intervertebral disc spaces are maintained.
IMPRESSION: Negative.

## 2022-04-26 NOTE — ED Provider Notes (Signed)
 Emergency Department Provider Note    ED Clinical Impression   Final diagnoses:  Pain of upper abdomen (Primary)  Acute gastritis without hemorrhage, unspecified gastritis type    ED Assessment/Plan    Condition: Stable Disposition: Discharge  This chart has been completed using Dragon Medical Dictation software, and while attempts have been made to ensure accuracy, certain words and phrases may not be transcribed as intended.   History  No chief complaint on file.  HPI  [name] is a 26 y.o. male  who presents today to the  emergency department complaining of intermittent abdominal pain for the last several weeks.  Patient states that pain is mostly epigastric, and sometimes burning.  Pain seems to be worse with eating certain foods.  He describes severe pain yesterday.  Pain has improved today significantly.  Patient states that symptoms have increased over since he had COVID a couple of weeks ago.  He has had some nausea but no vomiting.  He feels fine now.  Has no real symptoms at this time.    Allergies: has No Known Allergies. Medications: is not on any long-term medications. PMHx:  has a past medical history of Asthma. PSHx:  has a past surgical history that includes Finger surgery. SocHx:  reports that he has quit smoking. His smoking use included cigarettes. He has never used smokeless tobacco. He reports current drug use. Frequency: 7.00 times per week. Drug: Marijuana. Allergies, Medications, Medical, Surgical, and Social History were reviewed as documented above.   Social Determinants of Health with Concerns   Financial Resource Strain: Not on file  Internet Connectivity: Not on file  Food Insecurity: Not on file  Tobacco Use: Medium Risk (04/26/2022)   Patient History   . Smoking Tobacco Use: Former   . Smokeless Tobacco Use: Never   . Passive Exposure: Not on file  Housing/Utilities: Not on file  Alcohol Use: Not on file  Transportation  Needs: Not on file  Substance Use: Not on file  Health Literacy: Not on file  Physical Activity: Not on file  Interpersonal Safety: Not on file  Stress: Not on file  Intimate Partner Violence: Not on file  Depression: Not on file  Social Connections: Not on file     Review Of Systems  Review of Systems  Constitutional:  Negative for fever.  HENT:  Negative for congestion.   Respiratory:  Negative for chest tightness and shortness of breath.   Cardiovascular:  Negative for chest pain.  Gastrointestinal:  Positive for abdominal pain and nausea.  Skin:  Negative for color change.  Psychiatric/Behavioral:  Negative for behavioral problems.   All other systems reviewed and are negative.   Physical Exam   BP 143/85   Pulse 60   Temp 36.6 C (97.8 F) (Oral)   Resp 18   Ht 175.3 cm (5' 9)   Wt 94.3 kg (207 lb 12.8 oz)   SpO2 99%   BMI 30.69 kg/m   Physical Exam Vitals and nursing note reviewed.  Constitutional:      General: He is not in acute distress. HENT:     Head: Normocephalic.  Eyes:     Conjunctiva/sclera: Conjunctivae normal.  Cardiovascular:     Rate and Rhythm: Regular rhythm.     Pulses: Normal pulses.     Heart sounds: Normal heart sounds.  Pulmonary:     Effort: No respiratory distress.     Breath sounds: Normal breath sounds.  Abdominal:     General:  Bowel sounds are normal. There is no distension.     Palpations: Abdomen is soft.     Tenderness: There is abdominal tenderness. There is no right CVA tenderness, left CVA tenderness, guarding or rebound.     Comments: There is very mild epigastric and right upper quadrant tenderness.  No guarding or rebound.  Normoactive bowel sounds.  Musculoskeletal:        General: No deformity.  Skin:    General: Skin is warm.     Capillary Refill: Capillary refill takes 2 to 3 seconds.     Comments: Normal cap refill.  Neurological:     General: No focal deficit present.  Psychiatric:        Mood and  Affect: Mood normal.     ED Course  Medical Decision Making Differential in this includes gastritis versus peptic ulcer disease versus otitis versus cholelithiasis versus less likely cholecystitis.  Will check basic labs.  Get gallbladder ultrasound.  2:01 PM Patient is doing well.  Labs and imaging reviewed.  Patient is asymptomatic.  We will put him on a PPI.  He is stable for discharge.  I have reviewed my clinical findings and studies and my clinical impression with the patient. The patient has expressed understanding that at this time there is no evidence for a more malignant underlying process, but the patient also understands that early in the process of a condition such as this, an initial workup can be falsely reassuring. I have counseled the patient and discussed follow-up with the patient, stressing the importance of appropriate follow-up. I have also counseled the patient to return if worse or any concerns. Routine discharge counseling was given to the patient and the patient understands that worsening, changing or persistent symptoms should prompt an immediate call or follow up with their primary physician or return to the emergency department for reevaluation. Patient has expressed understanding.     Problems Addressed: Acute gastritis without hemorrhage, unspecified gastritis type: acute illness or injury that poses a threat to life or bodily functions Pain of upper abdomen: acute illness or injury that poses a threat to life or bodily functions  Amount and/or Complexity of Data Reviewed Labs: ordered. Decision-making details documented in ED Course. Radiology: ordered. Decision-making details documented in ED Course.  Risk Prescription drug management. Decision regarding hospitalization.     Procedures   No results found for this visit on 04/26/22 (from the past 4464 hour(s)).   ED Results Results for orders placed or performed during the hospital encounter of  04/26/22  Lipase Level  Result Value Ref Range   Lipase 28 16 - 77 U/L  Magnesium Level  Result Value Ref Range   Magnesium 2.0 1.6 - 2.6 mg/dL  Comprehensive Metabolic Panel  Result Value Ref Range   Sodium 138 135 - 145 mmol/L   Potassium 3.5 3.5 - 5.0 mmol/L   Chloride 103 98 - 107 mmol/L   CO2 31.0 21.0 - 32.0 mmol/L   Anion Gap 4 3 - 11 mmol/L   BUN 9 8 - 20 mg/dL   Creatinine 8.87 9.19 - 1.30 mg/dL   BUN/Creatinine Ratio 8    eGFR CKD-EPI (2021) Male >90 >=60 mL/min/1.78m2   Glucose 98 70 - 179 mg/dL   Calcium 9.1 8.5 - 89.8 mg/dL   Albumin 3.7 3.5 - 5.0 g/dL   Total Protein 7.6 6.0 - 8.0 g/dL   Total Bilirubin 0.5 0.3 - 1.2 mg/dL   AST 32 15 - 40 U/L  ALT 58 12 - 78 U/L   Alkaline Phosphatase 46 46 - 116 U/L  CBC w/ Differential  Result Value Ref Range   WBC 9.5 4.0 - 10.5 10*9/L   RBC 4.96 4.10 - 5.60 10*12/L   HGB 14.6 12.5 - 17.0 g/dL   HCT 56.8 63.9 - 49.9 %   MCV 86.9 80.0 - 98.0 fL   MCH 29.4 27.0 - 34.0 pg   MCHC 33.9 32.0 - 36.0 g/dL   RDW 87.8 88.4 - 85.4 %   MPV 9.9 7.4 - 10.4 fL   Platelet 318 140 - 415 10*9/L   Neutrophils % 54.7 %   Lymphocytes % 29.6 %   Monocytes % 11.5 %   Eosinophils % 3.5 %   Basophils % 0.5 %   Absolute Neutrophils 5.2 1.8 - 7.8 10*9/L   Absolute Lymphocytes 2.8 0.7 - 4.5 10*9/L   Absolute Monocytes 1.1 (H) 0.1 - 1.0 10*9/L   Absolute Eosinophils 0.3 0.0 - 0.4 10*9/L   Absolute Basophils 0.1 0.0 - 0.2 10*9/L   US  RUQ W Gallbladder  Result Date: 04/26/2022 CLINICAL DATA:  Right upper quadrant pain EXAM: ULTRASOUND ABDOMEN LIMITED RIGHT UPPER QUADRANT COMPARISON:  None Available. FINDINGS: Gallbladder: No gallstones or wall thickening visualized. No sonographic Murphy sign noted by sonographer. Common bile duct: Diameter: 2.4 mm Liver: No focal lesion identified. Within normal limits in parenchymal echogenicity. Portal vein is patent on color Doppler imaging with normal direction of blood flow towards the liver. Other:  None.   Normal right upper quadrant ultrasound for age Electronically Signed   By: CHRISTELLA.  Shick M.D.   On: 04/26/2022 13:30   XR Abdomen 2 Views and Chest 1 View  Result Date: 04/26/2022 CLINICAL DATA:  Abdominal pain. EXAM: DG ABDOMEN ACUTE WITH 1 VIEW CHEST COMPARISON:  May 22, 2017. FINDINGS: There is no evidence of dilated bowel loops or free intraperitoneal air. No radiopaque calculi or other significant radiographic abnormality is seen. Heart size and mediastinal contours are within normal limits. Both lungs are clear.   No abnormal bowel dilatation.  No acute cardiopulmonary disease. Electronically Signed   By: Lynwood Landy Raddle M.D.   On: 04/26/2022 13:14    Medications Administered:  Medications  pantoprazole (PROTONIX) injection 40 mg (40 mg Intravenous Given 04/26/22 1246)    Discharge Medications (Medications Prescribed during this  ED visit and Patient's Home Medications) :    Your Medication List     START taking these medications    esomeprazole 40 MG capsule Commonly known as: NexIUM Take 1 capsule (40 mg total) by mouth daily before breakfast.          Cherie Ardeen Hanger, MD 04/26/22 1615

## 2022-09-07 ENCOUNTER — Other Ambulatory Visit: Payer: Self-pay

## 2022-09-07 ENCOUNTER — Emergency Department (HOSPITAL_COMMUNITY)
Admission: EM | Admit: 2022-09-07 | Discharge: 2022-09-07 | Disposition: A | Discharge status: Home / Self Care | Payer: Medicaid Other | Attending: Student | Admitting: Student

## 2022-09-07 ENCOUNTER — Encounter (HOSPITAL_COMMUNITY): Payer: Self-pay | Admitting: *Deleted

## 2022-09-07 DIAGNOSIS — M542 Cervicalgia: Secondary | ICD-10-CM | POA: Insufficient documentation

## 2022-09-07 DIAGNOSIS — R519 Headache, unspecified: Secondary | ICD-10-CM | POA: Insufficient documentation

## 2022-09-07 DIAGNOSIS — Z7952 Long term (current) use of systemic steroids: Secondary | ICD-10-CM | POA: Insufficient documentation

## 2022-09-07 DIAGNOSIS — J45909 Unspecified asthma, uncomplicated: Secondary | ICD-10-CM | POA: Insufficient documentation

## 2022-09-07 DIAGNOSIS — Z7951 Long term (current) use of inhaled steroids: Secondary | ICD-10-CM | POA: Diagnosis not present

## 2022-09-07 MED ORDER — DEXAMETHASONE SODIUM PHOSPHATE 10 MG/ML IJ SOLN
10.0000 mg | Freq: Once | INTRAMUSCULAR | Status: AC
  Administered 2022-09-07: 10 mg via INTRAMUSCULAR

## 2022-09-07 MED ORDER — METOCLOPRAMIDE HCL 10 MG PO TABS
10.0000 mg | ORAL_TABLET | Freq: Once | ORAL | Status: AC
  Administered 2022-09-07: 10 mg via ORAL
  Filled 2022-09-07: qty 1

## 2022-09-07 MED ORDER — DEXAMETHASONE SODIUM PHOSPHATE 10 MG/ML IJ SOLN
10.0000 mg | Freq: Once | INTRAMUSCULAR | Status: DC
  Filled 2022-09-07: qty 1

## 2022-09-07 NOTE — Discharge Instructions (Signed)
You were evaluated today for headache which resolved with Reglan and Decadron administration.  Please establish care with a primary care provider for further evaluation and management of chronic conditions.  If you experience worse headache of your life, I sudden onset headache, or other life-threatening symptoms, please return to the emergency department.

## 2022-09-07 NOTE — ED Triage Notes (Signed)
Pt with HA that "starts to left temple" for a week.  Denies any N/v.  Also c/o posterior neck pain as well. Sensitive to light at times.  Pt states he does not usually get headaches.  Pt states he has been using Excedrin for the pain .

## 2022-09-07 NOTE — ED Provider Notes (Signed)
[name]   CSN: [id number] Arrival date & time: 09/07/22  2036     History  Chief Complaint  Patient presents with   Headache    [name] is a 27 y.o. male.  Patient presents emergency room complaining of a headache in the left side of his head which is been ongoing for approximately 1 week.  He also endorses some mild neck stiffness, worse than usual, and photosensitivity.  Patient states he does not get frequent headaches but has had them in the past.  This was not sudden onset and the patient states this is not the worst headache he has experienced.  He states he has been taking Excedrin at home which has been helping to alleviate symptoms.  Patient denies nausea, vomiting, fever.  Past medical history significant for asthma  HPI     Home Medications Prior to Admission medications   Medication Sig Start Date End Date Taking? Authorizing Provider  acetaminophen (TYLENOL) 325 MG tablet Take 650 mg by mouth every 6 (six) hours as needed for moderate pain.    [provider]  albuterol (PROVENTIL HFA;VENTOLIN HFA) 108 (90 Base) MCG/ACT inhaler Inhale 1-2 puffs every 6 (six) hours as needed into the lungs for wheezing or shortness of breath. 05/22/17   Maczis, Barth Kirks, PA-C  azithromycin (ZITHROMAX) 250 MG tablet Take 1 tablet (250 mg total) by mouth daily. Take first 2 tablets together, then 1 every day until finished. 11/26/17   Kirichenko, Lahoma Rocker, PA-C  diphenhydramine-acetaminophen (TYLENOL PM) 25-500 MG TABS tablet Take 1 tablet at bedtime as needed by mouth (pain).    [provider]  guaiFENesin (MUCINEX) 600 MG 12 hr tablet Take 600 mg 2 (two) times daily as needed by mouth for cough.    [provider]  ibuprofen (ADVIL) 600 MG tablet Take 1 tablet (600 mg total) by mouth every 6 (six) hours as needed. 06/05/19   Joy, Shawn C, PA-C  lidocaine (LIDODERM) 5 % Place 1 patch onto the  skin daily. Remove & Discard patch within 12 hours or as directed by MD 06/05/19   Joy, Shawn C, PA-C  methocarbamol (ROBAXIN) 500 MG tablet Take 1 tablet (500 mg total) by mouth 2 (two) times daily. 06/05/19   Joy, Shawn C, PA-C  oxyCODONE-acetaminophen (PERCOCET/ROXICET) 5-325 MG tablet Take 1-2 tablets by mouth every 6 (six) hours as needed for severe pain. 06/05/19   Joy, Shawn C, PA-C  predniSONE (DELTASONE) 20 MG tablet Take 2 tablets (40 mg total) by mouth daily. 11/26/17   Kirichenko, Tatyana, PA-C  Pseudoephedrine-APAP-DM (DAYQUIL MULTI-SYMPTOM COLD/FLU PO) Take 1 capsule daily as needed by mouth (cold).    [provider]      Allergies    Patient has no known allergies.    Review of Systems   Review of Systems  Constitutional:  Negative for fever.  Eyes:  Positive for photophobia.  Gastrointestinal:  Negative for nausea and vomiting.  Musculoskeletal:  Positive for neck pain.  Neurological:  Positive for headaches.    Physical Exam Updated Vital Signs BP (!) 147/96   Pulse 69   Temp 97.7 F (36.5 C) (Oral)   Resp 16   Ht '5\' 9"'$  (1.753 m)   Wt 90.7 kg   SpO2 97%   BMI 29.53 kg/m  Physical Exam Vitals and nursing [name] reviewed.  Constitutional:      General: He is not in acute distress.  Appearance: He is well-developed.  HENT:     Head: Normocephalic and atraumatic.  Eyes:     Extraocular Movements: Extraocular movements intact.     Right eye: No nystagmus.     Left eye: No nystagmus.     Conjunctiva/sclera: Conjunctivae normal.     Pupils: Pupils are equal, round, and reactive to light.  Neck:     Meningeal: Brudzinski's sign and Kernig's sign absent.  Cardiovascular:     Rate and Rhythm: Normal rate and regular rhythm.     Heart sounds: No murmur heard. Pulmonary:     Effort: Pulmonary effort is normal. No respiratory distress.     Breath sounds: Normal breath sounds.  Abdominal:     Palpations: Abdomen is soft.     Tenderness: There is no  abdominal tenderness.  Musculoskeletal:        General: No swelling.     Cervical back: Neck supple. No rigidity.  Skin:    General: Skin is warm and dry.     Capillary Refill: Capillary refill takes less than 2 seconds.  Neurological:     Mental Status: He is alert.  Psychiatric:        Mood and Affect: Mood normal.     ED Results / Procedures / Treatments   Labs (all labs ordered are listed, but only abnormal results are displayed) Labs Reviewed - No data to display  EKG None  Radiology No results found.  Procedures Procedures    Medications Ordered in ED Medications  metoCLOPramide (REGLAN) tablet 10 mg (10 mg Oral Given 09/07/22 2112)  dexamethasone (DECADRON) injection 10 mg (10 mg Intramuscular Given 09/07/22 2113)    ED Course/ Medical Decision Making/ A&P                             Medical Decision Making Risk Prescription drug management.   Patient presents with chief complaint of headache.  Differential diagnosis includes was not limited to migraine, tension headache, other headache disorder, meningitis, others  The patient has no recent medical visits for review that are relevant  The patient has no red flag symptoms such as sudden onset or worst headache of life to require CT of the head at this time  I ordered the patient medication including Reglan and Decadron.  Upon reassessment the patient was doing much better.  Plan to discharge patient home at this time.  Patient with negative Brudzinski, Kernig's on exam.  No fevers.  Very low clinical suspicion of meningitis.  Patient's headache was one-sided with photosensitivity.  Question if this could have been a migraine.  Plan to discharge home at this time with recommendations for establishing care with primary care.  Patient voices understanding.  Return precautions provided.        Final Clinical Impression(s) / ED Diagnoses Final diagnoses:  Acute nonintractable headache, unspecified headache  type    Rx / DC Orders ED Discharge Orders     None         Ronny Bacon 09/07/22 2207    Teressa Lower, MD 09/07/22 2226

## 2024-02-14 ENCOUNTER — Other Ambulatory Visit: Payer: Self-pay

## 2024-02-14 ENCOUNTER — Emergency Department (HOSPITAL_COMMUNITY)
Admission: EM | Admit: 2024-02-14 | Discharge: 2024-02-14 | Disposition: A | Discharge status: Home / Self Care | Attending: Emergency Medicine | Admitting: Emergency Medicine

## 2024-02-14 ENCOUNTER — Emergency Department (HOSPITAL_COMMUNITY)

## 2024-02-14 DIAGNOSIS — J45909 Unspecified asthma, uncomplicated: Secondary | ICD-10-CM | POA: Diagnosis not present

## 2024-02-14 DIAGNOSIS — Z79899 Other long term (current) drug therapy: Secondary | ICD-10-CM | POA: Diagnosis not present

## 2024-02-14 DIAGNOSIS — M25521 Pain in right elbow: Secondary | ICD-10-CM | POA: Diagnosis present

## 2024-02-14 DIAGNOSIS — Z7951 Long term (current) use of inhaled steroids: Secondary | ICD-10-CM | POA: Diagnosis not present

## 2024-02-14 MED ORDER — METHOCARBAMOL 500 MG PO TABS: 500.0000 mg | ORAL_TABLET | Freq: Two times a day (BID) | ORAL | 0 refills | Status: AC

## 2024-02-14 MED ORDER — KETOROLAC TROMETHAMINE 15 MG/ML IJ SOLN
15.0000 mg | Freq: Once | INTRAMUSCULAR | Status: AC
Start: 2024-02-14 — End: 2024-02-14
  Administered 2024-02-14: 15 mg via INTRAMUSCULAR
  Filled 2024-02-14: qty 1

## 2024-02-14 NOTE — Discharge Instructions (Addendum)
 You were evaluated in the emergency room for elbow pain.  Your x-ray did not show any significant abnormality.  Your exam is most consistent with a tennis elbow which is inflammation of the tendons that lift your wrist.  Please pick up a tennis elbow strap at a pharmacy or [employer].  Recommend alternating Tylenol  and ibuprofen .  Please follow-up with the orthopedic doctor.  If you experience any new or worsening symptoms including significant elbow swelling, redness, warmth, fevers or chills please return to emergency room.

## 2024-02-14 NOTE — ED Provider Notes (Signed)
 [place] EMERGENCY DEPARTMENT AT [facility] Provider Note   CSN: [id number] Arrival date & time: 02/14/24  1026     Patient presents with: right elbow pain   [name] is a 28 y.o. male with history of asthma presents with complaints of right elbow pain.  States it started hurting the past 3 days.  Pain is significant with range of motion.  No numbness or tingling.  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  No prior surgeries to the elbow.  Denies any IV drug use.    HPI    Past Medical History:  Diagnosis Date   Asthma    Past Surgical History:  Procedure Laterality Date   NERVE, TENDON AND ARTERY REPAIR Right 08/14/2013   Procedure:  REPAIR OF INDEX FINGER DIGITAL NERVE AND REPAIR OF LONG FINGER DIGITAL NERVE AND ARTERY. REPAIR OF INDEX FLEXOR TENDON.;  Surgeon: Franky JONELLE Curia, MD;  Location: [facility] OR;  Service: Orthopedics;  Laterality: Right;     Prior to Admission medications   Medication Sig Start Date End Date Taking? Authorizing Provider  acetaminophen  (TYLENOL ) 325 MG tablet Take 650 mg by mouth every 6 (six) hours as needed for moderate pain.    [provider]  albuterol  (PROVENTIL  HFA;VENTOLIN  HFA) 108 (90 Base) MCG/ACT inhaler Inhale 1-2 puffs every 6 (six) hours as needed into the lungs for wheezing or shortness of breath. 05/22/17   Maczis, Michael M, PA-C  azithromycin  (ZITHROMAX ) 250 MG tablet Take 1 tablet (250 mg total) by mouth daily. Take first 2 tablets together, then 1 every day until finished. 11/26/17   Kirichenko, Tatyana, PA-C  diphenhydramine-acetaminophen  (TYLENOL  PM) 25-500 MG TABS tablet Take 1 tablet at bedtime as needed by mouth (pain).    [provider]  guaiFENesin  (MUCINEX ) 600 MG 12 hr tablet Take 600 mg 2 (two) times daily as needed by mouth for cough.    [provider]  ibuprofen  (ADVIL ) 600 MG tablet Take 1 tablet (600 mg total) by mouth every  6 (six) hours as needed. 06/05/19   Joy, Shawn C, PA-C  lidocaine  (LIDODERM ) 5 % Place 1 patch onto the skin daily. Remove & Discard patch within 12 hours or as directed by MD 06/05/19   Joy, Shawn C, PA-C  methocarbamol  (ROBAXIN ) 500 MG tablet Take 1 tablet (500 mg total) by mouth 2 (two) times daily. 02/14/24   Donnajean Lynwood DEL, PA-C  oxyCODONE -acetaminophen  (PERCOCET/ROXICET) 5-325 MG tablet Take 1-2 tablets by mouth every 6 (six) hours as needed for severe pain. 06/05/19   Joy, Shawn C, PA-C  predniSONE  (DELTASONE ) 20 MG tablet Take 2 tablets (40 mg total) by mouth daily. 11/26/17   Kirichenko, Tatyana, PA-C  Pseudoephedrine-APAP-DM (DAYQUIL MULTI-SYMPTOM COLD/FLU PO) Take 1 capsule daily as needed by mouth (cold).    [provider]    Allergies: Patient has no known allergies.    Review of Systems  Musculoskeletal:  Positive for myalgias.    Updated Vital Signs BP (!) 128/107 (BP Location: Left Arm)   Pulse 80   Temp 98.6 F (37 C) (Oral)   Resp 18   Ht 5' 9 (1.753 m)   Wt 90.7 kg   SpO2 93%   BMI 29.53 kg/m   Physical Exam Vitals and nursing note reviewed.  Constitutional:      General: He is not in acute distress.    Appearance: He is well-developed.  HENT:  Head: Normocephalic and atraumatic.  Eyes:     Conjunctiva/sclera: Conjunctivae normal.  Cardiovascular:     Pulses: Normal pulses.     Heart sounds: No murmur heard. Pulmonary:     Effort: Pulmonary effort is normal. No respiratory distress.  Musculoskeletal:        General: No swelling.     Cervical back: Neck supple.     Comments: Right elbow with significant lateral tenderness, notable discomfort with range of motion, lacks full pronation, supination, extension and flexion due to discomfort, significant discomfort with resisted wrist extension, compartments are soft, no gross deformities, radial pulse 2+  Skin:    General: Skin is warm and dry.     Capillary Refill: Capillary refill takes less  than 2 seconds.  Neurological:     Mental Status: He is alert.  Psychiatric:        Mood and Affect: Mood normal.     (all labs ordered are listed, but only abnormal results are displayed) Labs Reviewed - No data to display  EKG: None  Radiology: DG Elbow Complete Right Result Date: 02/14/2024 EXAM: 3 VIEW XRAY OF THE RIGHT ELBOW COMPARISON: None available. CLINICAL HISTORY: Patient arrived via POV from home for right elbow pain that started 3 days ago. No known injury. 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 Stated due to pain unable to move or bend the arm. Pain has increased and now is 9/10. Took one muscle relaxer pill around 4 am. Pt with limited movement of right arm. FINDINGS: BONES AND JOINTS: No acute fracture. No focal osseous lesion. No joint dislocation. SOFT TISSUES: The soft tissues are unremarkable. IMPRESSION: 1. No acute abnormality. Electronically signed by: Lonni Necessary MD 02/14/2024 11:30 AM EDT RP Workstation: [id number]     Procedures   Medications Ordered in the ED  ketorolac  (TORADOL ) 15 MG/ML injection 15 mg (has no administration in time range)                                    Medical Decision Making Amount and/or Complexity of Data Reviewed Radiology: ordered.   This patient presents to the ED with chief complaint(s) of elbow pain.  The complaint involves an extensive differential diagnosis and also carries with it a high risk of complications and morbidity.   Pertinent past medical history as listed in HPI  The differential diagnosis includes  Septic joint, gout, fracture, dislocation, tendinitis Additional history obtained: Records reviewed Care Everywhere/External Records  Assessment and management:   Patient presents with complaints of atraumatic right elbow pain that is been hurting for the past 3 days has significant pain with any movement and lacks full range of motion due to the discomfort. Do not suspect septic joint or gout  because there is no swelling, erythema or warmth.  X-ray ordered in triage demonstrates no acute abnormality.  No evidence of fracture or dislocation. His exam is most concerning for musculoskeletal etiology such as tendinitis.  I suspect his range of motion is limited due to prolonged guarding.  I have encouraged him to work on his range of motion.  Educated on picking up a tennis elbow strap.  Given his difficulty with range of motion we will send a prescription for Robaxin .  Sedation warnings provided.  Will provide him with a shot of Toradol  here as well as a Ortho referral.   Independent ECG interpretation:  none  Independent labs interpretation:  The  following labs were independently interpreted:  none  Independent visualization and interpretation of imaging: I independently visualized the following imaging with scope of interpretation limited to determining acute life threatening conditions related to emergency care: X-ray elbow without acute abnormality   Consultations obtained:   none  Disposition:   Patient will be discharged home. The patient has been appropriately medically screened and/or stabilized in the ED. I have low suspicion for any other emergent medical condition which would require further screening, evaluation or treatment in the ED or require inpatient management. At time of discharge the patient is hemodynamically stable and in no acute distress. I have discussed work-up results and diagnosis with patient and answered all questions. Patient is agreeable with discharge plan. We discussed strict return precautions for returning to the emergency department and they verbalized understanding.     Social Determinants of Health:   none  This note was dictated with voice recognition software.  Despite best efforts at proofreading, errors may have occurred which can change the documentation meaning.       Final diagnoses:  Right elbow pain    ED Discharge Orders           Ordered    methocarbamol  (ROBAXIN ) 500 MG tablet  2 times daily        02/14/24 1138               Donnajean Lynwood VEAR DEVONNA 02/14/24 1144    Suzette Pac, MD 02/14/24 1705

## 2024-02-14 NOTE — ED Triage Notes (Addendum)
 Patient arrived via POV from home for right elbow pain that started 3 days ago. No known injury. 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 Alert and oriented on arrival. Ambulatory. Stated due to pain unable to move or bend the arm. Pain has increased and  now is 9/10. Took one muscle relaxer pill around 4 am.
